# Patient Record
Sex: Male | Born: 1970 | Race: White | Hispanic: No | Marital: Married | Smoking: Current every day smoker
Health system: Southern US, Community
[De-identification: ages and names within clinical notes are randomized; demographics above are authoritative.]

## PROBLEM LIST (undated history)

## (undated) DIAGNOSIS — R011 Cardiac murmur, unspecified: Secondary | ICD-10-CM

## (undated) DIAGNOSIS — J45909 Unspecified asthma, uncomplicated: Secondary | ICD-10-CM

## (undated) DIAGNOSIS — K219 Gastro-esophageal reflux disease without esophagitis: Secondary | ICD-10-CM

---

## 2000-09-14 HISTORY — PX: ESOPHAGOGASTRODUODENOSCOPY: SHX1529

## 2001-07-18 ENCOUNTER — Inpatient Hospital Stay (HOSPITAL_COMMUNITY): Admission: EM | Admit: 2001-07-18 | Discharge: 2001-07-19 | Payer: Self-pay | Admitting: Emergency Medicine

## 2001-07-25 ENCOUNTER — Encounter: Admission: RE | Admit: 2001-07-25 | Discharge: 2001-07-25 | Payer: Self-pay

## 2005-12-21 ENCOUNTER — Emergency Department (HOSPITAL_COMMUNITY): Admission: EM | Admit: 2005-12-21 | Discharge: 2005-12-21 | Payer: Self-pay | Admitting: Family Medicine

## 2009-04-20 ENCOUNTER — Emergency Department (HOSPITAL_COMMUNITY): Admission: EM | Admit: 2009-04-20 | Discharge: 2009-04-20 | Payer: Self-pay | Admitting: Emergency Medicine

## 2011-01-30 NOTE — Discharge Summary (Signed)
[place]. [facility]  Patient:    Bryan, Flynn Visit Number: [id number] MRN: [id number]          Service Type: MED Location: [id number] Attending Physician:  Edwyna Perfect Dictated by:   Lendell Caprice, M.D. Admit Date:  07/18/2001 Discharge Date: 07/19/2001                             Discharge Summary  CONSULTATIONS:  Dr. Melvia Heaps from gastroenterology.  DISCHARGE DIAGNOSES: 1. 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 2. Tobacco abuse. 3. Migraine headaches. 4. Viral gastroenteritis.  DISCHARGE MEDICATIONS: 1. Protonix 40 mg p.o. q.d. 2. Tylenol Migraine 650 mg p.o. q.6h. p.r.n. migraine headache.  FOLLOW-UP:  Mr. Chanthavong will be seen in the [facility] by Dr. Reed Pandy on Monday, July 25, 2001, at 3:45 p.m.  At that time the follow-up CBC will be obtained to assess the patients anemia.  PROCEDURES AND DIAGNOSTIC STUDIES:  EGD performed on July 18, 2001, by Dr. Melvia Heaps.  Impression:  Mallory-Weiss tear.  This was injected with epinephrine 4 cc, an four endoscopic clips were applied.  The patient tolerated this procedure well, without complications.  HISTORY OF PRESENT ILLNESS:  [name] is a 40 year old white male with no significant past medical history of who presents to the ED complaining of hematemesis.  He reports feeling nauseated and began vomiting bright red blood starting at 1 a.m.Marland Kitchen  Previous to that he had vomited several times nonbilious material.  He had approximately 10 episodes of vomiting overnight and early morning.  He denied any abdominal pain, fever, melena, diarrhea, or bright red blood per rectum.  He denies any changes in his stool.  His last bowel movement was this morning and was normal.  He reports a long history of heartburn, epigastric pain that he usually treats with over-the-counter antacids for approximately one year.  He smokes a pack  per day of cigarettes and eats spicy foods on occasion.  He reports sweats, chills with his vomiting earlier yesterday but denies any headache, rhinorrhea, or cough.  He also denies any chest pain, heart palpation, or night sweats.  ADMISSION LABORATORY DATA:  PT 13.3, PTT 28, INR 1.0.  Sodium 139, potassium 4.0, chloride 103, CO2 29, BUN 22, creatinine 0.9, glucose 111, total bilirubin 0.8, alkaline phosphatase 57, SGOT 16, SGPT 16, total protein 7.4, albumin 4.1, calcium 9.1.  White blood count 11.9, hemoglobin 15.4, platelets 271, MCV 91.7.  HOSPITAL COURSE: #1 - The patient was evaluated in the emergency room, and a stat GI consult was obtained.  He was taken immediately to the endoscopy laboratory, and a Mallory-Weiss tear was found in the lower esophagus.  The patient got four endoscopic clips and epinephrine injection.  He was admitted to the ICU with two large-bore peripheral IVs.  He was typed and screened for two units of packed red blood cells.  CBCs were followed q.6h. during his hospitalization, and he never required transfusion.  He was started on IV Protonix 40 mg q.d. and n.p.o. until GI saw him.  On follow-up after the procedure he was advanced to a clear liquid diet, and this was tolerated without complication.  He had no more emesis during his hospitalization, and advanced to a full diet on July 18, 2001.  He was discharged on Protonix with follow-up planned.  #2 - TOBACCO ABUSE:  The patient smokes about one-half to  one pack per day x 10 years.  He was offered counseling and did not desire nicotine replacement at that time.  This will need to be followed in the outpatient setting, and cessation classes and recommendations were given.  #3 - MIGRAINE HEADACHES:  The patient reported history of occasional migraine headaches since he was a child.  He was encouraged to take Tylenol Migraine p.r.n. and avoid Goodys powders, NSAIDs.  DISCHARGE LABORATORY DATA:  Sodium  140, potassium 3.7, chloride 111, CO2 25, glucose 101, BUN 9, creatinine 0.8, total bilirubin 0.5.  White blood count 11, hemoglobin 10, MCV 91.4, platelets 199. Dictated by:   Lendell Caprice, M.D. Attending Physician:  Edwyna Perfect DD:  08/11/01 TD:  08/12/01 Job: [id number]

## 2016-09-28 ENCOUNTER — Emergency Department (HOSPITAL_COMMUNITY)
Admission: EM | Admit: 2016-09-28 | Discharge: 2016-09-29 | Disposition: A | Discharge status: Home / Self Care | Payer: Self-pay | Attending: Emergency Medicine | Admitting: Emergency Medicine

## 2016-09-28 ENCOUNTER — Emergency Department (HOSPITAL_COMMUNITY): Payer: Self-pay

## 2016-09-28 ENCOUNTER — Encounter (HOSPITAL_COMMUNITY): Payer: Self-pay | Admitting: Emergency Medicine

## 2016-09-28 DIAGNOSIS — Z7982 Long term (current) use of aspirin: Secondary | ICD-10-CM | POA: Insufficient documentation

## 2016-09-28 DIAGNOSIS — F17200 Nicotine dependence, unspecified, uncomplicated: Secondary | ICD-10-CM | POA: Insufficient documentation

## 2016-09-28 DIAGNOSIS — R002 Palpitations: Secondary | ICD-10-CM | POA: Insufficient documentation

## 2016-09-28 DIAGNOSIS — E876 Hypokalemia: Secondary | ICD-10-CM | POA: Insufficient documentation

## 2016-09-28 DIAGNOSIS — Z79899 Other long term (current) drug therapy: Secondary | ICD-10-CM | POA: Insufficient documentation

## 2016-09-28 LAB — BASIC METABOLIC PANEL
ANION GAP: 9 (ref 5–15)
BUN: 7 mg/dL (ref 6–20)
CALCIUM: 9.5 mg/dL (ref 8.9–10.3)
CO2: 24 mmol/L (ref 22–32)
Chloride: 102 mmol/L (ref 101–111)
Creatinine, Ser: 1.07 mg/dL (ref 0.61–1.24)
GFR calc Af Amer: >60 mL/min (ref >60)
GLUCOSE: 125 mg/dL — AB (ref 65–99)
POTASSIUM: 3.1 mmol/L — AB (ref 3.5–5.1)
SODIUM: 135 mmol/L (ref 135–145)

## 2016-09-28 LAB — RAPID URINE DRUG SCREEN, HOSP PERFORMED
AMPHETAMINES: NOT DETECTED
Barbiturates: NOT DETECTED
Benzodiazepines: NOT DETECTED
Cocaine: NOT DETECTED
Opiates: NOT DETECTED
TETRAHYDROCANNABINOL: POSITIVE — AB

## 2016-09-28 LAB — CBC
HEMATOCRIT: 45.1 % (ref 39.0–52.0)
HEMOGLOBIN: 16.2 g/dL (ref 13.0–17.0)
MCH: 31.5 pg (ref 26.0–34.0)
MCHC: 35.9 g/dL (ref 30.0–36.0)
MCV: 87.7 fL (ref 78.0–100.0)
Platelets: 344 10*3/uL (ref 150–400)
RBC: 5.14 MIL/uL (ref 4.22–5.81)
RDW: 12.9 % (ref 11.5–15.5)
WBC: 12.1 10*3/uL — AB (ref 4.0–10.5)

## 2016-09-28 LAB — TSH: TSH: 2.054 u[IU]/mL (ref 0.350–4.500)

## 2016-09-28 LAB — URINALYSIS, ROUTINE W REFLEX MICROSCOPIC
BILIRUBIN URINE: NEGATIVE
Bacteria, UA: NONE SEEN
GLUCOSE, UA: NEGATIVE mg/dL
Ketones, ur: 5 mg/dL — AB
NITRITE: NEGATIVE
PH: 6 (ref 5.0–8.0)
Protein, ur: NEGATIVE mg/dL
SPECIFIC GRAVITY, URINE: 1.025 (ref 1.005–1.030)

## 2016-09-28 LAB — MAGNESIUM: Magnesium: 2.1 mg/dL (ref 1.7–2.4)

## 2016-09-28 LAB — D-DIMER, QUANTITATIVE: D-Dimer, Quant: <0.27 ug/mL-FEU (ref 0.00–0.50)

## 2016-09-28 LAB — I-STAT TROPONIN, ED: TROPONIN I, POC: 0.04 ng/mL (ref 0.00–0.08)

## 2016-09-28 MED ORDER — POTASSIUM CHLORIDE CRYS ER 20 MEQ PO TBCR: 20.0000 meq | EXTENDED_RELEASE_TABLET | Freq: Every day | ORAL | 0 refills | Status: DC

## 2016-09-28 MED ORDER — SODIUM CHLORIDE 0.9 % IV BOLUS (SEPSIS)
1000.0000 mL | Freq: Once | INTRAVENOUS | Status: AC
  Administered 2016-09-28: 1000 mL via INTRAVENOUS

## 2016-09-28 MED ORDER — MAGNESIUM SULFATE 2 GM/50ML IV SOLN
2.0000 g | Freq: Once | INTRAVENOUS | Status: AC
  Administered 2016-09-28: 2 g via INTRAVENOUS
  Filled 2016-09-28: qty 50

## 2016-09-28 MED ORDER — POTASSIUM CHLORIDE CRYS ER 20 MEQ PO TBCR
40.0000 meq | EXTENDED_RELEASE_TABLET | Freq: Once | ORAL | Status: AC
  Administered 2016-09-28: 40 meq via ORAL
  Filled 2016-09-28: qty 2

## 2016-09-28 NOTE — ED Provider Notes (Signed)
[facility]EM[facility]GENCY DE[facility] Provider Note   CSN: [id number] Arrival date & time: 09/28/16  2025  By signing my name below, I, Modena JanskyAlbert Thayil, attest that this documentation has been prepared under the direction and in the presence of non-physician practitioner, Wynetta EmeryNicole Katieann Hungate, PA. Electronically Signed: Modena JanskyAlbert Thayil, Scribe. 09/28/2016. 9:42 PM.  History   Chief Complaint Chief Complaint  Patient presents with  . Chest Pain   The history is provided by the patient. No language interpreter was used.   HPI Comments: Bryan Flynn is a 46 y.o. male with no pertinent PMHx who presents to the Emergency Department complaining of Intermittent moderate heart palpitations that started about 9 hours ago. He states he had multiple episodes of sudden onset heart racing/fluttering. No modifying factors. He has associated lightheadedness and "tunnel vision" with episodes. His symptoms are currently not present. He admits to a hx of smoking. He denies any hx of anxiety, leg swelling, chest pain, abdominal pain, vomiting, fever, generalized myalgias, cough, melena, blood in stool, or other complaints.   History reviewed. No pertinent past medical history.  There are no active problems to display for this patient.   Past Surgical History:  Procedure Laterality Date  . THROAT SURGERY         Home Medications    Prior to Admission medications   Medication Sig Start Date End Date Taking? Authorizing Provider  Aspirin-Acetaminophen-Caffeine (GOODYS EXTRA STRENGTH PO) Take 1-2 packets by mouth every 6 (six) hours as needed (for pain).   Yes Historical Provider, MD  DiphenhydrAMINE HCl, Sleep, (SLEEP AID) 50 MG CAPS Take 100-150 mg by mouth at bedtime.   Yes Historical Provider, MD  potassium chloride SA (K-DUR,KLOR-CON) 20 MEQ tablet Take 1 tablet (20 mEq total) by mouth daily. 09/28/16   Joni ReiningNicole Lilee Aldea, PA-C    Family History No family history on file.  Social History Social History  Substance  Use Topics  . Smoking status: Current Every Day Smoker  . Smokeless tobacco: Never Used  . Alcohol use No     Allergies   Patient has no known allergies.   Review of Systems Review of Systems A complete 10 system review of systems was obtained and all systems are negative except as noted in the HPI and PMH.   Physical Exam Updated Vital Signs BP 125/95 (BP Location: Left Arm)   Pulse 111   Temp 98.4 F (36.9 C) (Oral)   Resp 17   Ht 5\' 11"  (1.803 m)   Wt 168 lb (76.2 kg)   SpO2 97%   BMI 23.43 kg/m   Physical Exam  Constitutional: He is oriented to person, place, and time. He appears well-developed and well-nourished. No distress.  HENT:  Head: Normocephalic.  Mouth/Throat: Oropharynx is clear and moist.  Eyes: Conjunctivae are normal.  Neck: Normal range of motion. No JVD present. No tracheal deviation present.  Cardiovascular: Regular rhythm and intact distal pulses.   Mild tachycardia, regular with no murmurs.   Radial pulse equal bilaterally  Pulmonary/Chest: Effort normal and breath sounds normal. No stridor. No respiratory distress. He has no wheezes. He has no rales. He exhibits no tenderness.  Abdominal: Soft. He exhibits no distension and no mass. There is no tenderness. There is no rebound and no guarding.  Musculoskeletal: Normal range of motion. He exhibits no edema or tenderness.  No calf asymmetry, superficial collaterals, palpable cords, edema, Homans sign negative bilaterally.    Neurological: He is alert and oriented to person, place, and time.  Skin: Skin  is warm. He is not diaphoretic.  Psychiatric: He has a normal mood and affect.  Nursing note and vitals reviewed.    ED Treatments / Results  DIAGNOSTIC STUDIES: Oxygen Saturation is 97% on RA, normal by my interpretation.    COORDINATION OF CARE: 9:46 PM- Pt advised of plan for treatment and pt agrees.  Labs (all labs ordered are listed, but only abnormal results are displayed) Labs  Reviewed  BASIC METABOLIC PANEL - Abnormal; Notable for the following:       Result Value   Potassium 3.1 (*)    Glucose, Bld 125 (*)    All other components within normal limits  CBC - Abnormal; Notable for the following:    WBC 12.1 (*)    All other components within normal limits  RAPID URINE DRUG SCREEN, HOSP PERFORMED - Abnormal; Notable for the following:    Tetrahydrocannabinol POSITIVE (*)    All other components within normal limits  URINALYSIS, ROUTINE W REFLEX MICROSCOPIC - Abnormal; Notable for the following:    APPearance HAZY (*)    Hgb urine dipstick SMALL (*)    Ketones, ur 5 (*)    Leukocytes, UA TRACE (*)    Squamous Epithelial / LPF 0-5 (*)    All other components within normal limits  TSH  D-DIMER, QUANTITATIVE (NOT AT [facility])  MAGNESIUM  I-STAT TROPOININ, ED    EKG  EKG Interpretation  Date/Time:  Monday September 28 2016 20:30:47 EST Ventricular Rate:  106 PR Interval:    QRS Duration: 99 QT Interval:  328 QTC Calculation: 436 R Axis:   68 Text Interpretation:  Sinus tachycardia no acute ischemic appearance. Otherwise normal ECG No old tracing to compare Confirmed by Donnald Garre, MD, Lebron Conners [phone]) on 09/28/2016 10:51:57 PM       Radiology Dg Chest 2 View  Result Date: 09/28/2016 CLINICAL DATA:  Lightheadedness, racing heart EXAM: CHEST  2 VIEW COMPARISON:  None. FINDINGS: The heart size and mediastinal contours are within normal limits. Both lungs are clear. The visualized skeletal structures are unremarkable. IMPRESSION: No active cardiopulmonary disease. Electronically Signed   By: Jasmine Pang M.D.   On: 09/28/2016 21:08    Procedures Procedures (including critical care time)  Medications Ordered in ED Medications  potassium chloride SA (K-DUR,KLOR-CON) CR tablet 40 mEq (40 mEq Oral Given 09/28/16 2233)  sodium chloride 0.9 % bolus 1,000 mL (0 mLs Intravenous Stopped 09/29/16 0018)  magnesium sulfate IVPB 2 g 50 mL (0 g Intravenous Stopped 09/28/16  2333)     Initial Impression / Assessment and Plan / ED Course  I have reviewed the triage vital signs and the nursing notes.  Pertinent labs & imaging results that were available during my care of the patient were reviewed by me and considered in my medical decision making (see chart for details).  Clinical Course      Vitals:   09/28/16 2300 09/28/16 2330 09/29/16 0000 09/29/16 0016  BP: 116/82 111/81 119/86   Pulse:    81  Resp: 18 25 18 22   Temp:      TempSrc:      SpO2:    93%  Weight:      Height:        Medications  potassium chloride SA (K-DUR,KLOR-CON) CR tablet 40 mEq (40 mEq Oral Given 09/28/16 2233)  sodium chloride 0.9 % bolus 1,000 mL (0 mLs Intravenous Stopped 09/29/16 0018)  magnesium sulfate IVPB 2 g 50 mL (0 g Intravenous Stopped 09/28/16 2333)  Bryan Flynn is 46 y.o. male presenting with Intermittent tachycardia with presyncope. No associated chest pain mild hypokalemia, EKG with sinus tachycardia. Mild hypokalemia with normal magnesium. Patient is given a liter bolus and tachycardia has resolved, vital signs are not orthostatic. Negative d-dimer, normal TSH, no amphetamines on UDS. She possibly be due to a dehydration. There is no associated chest pain with it. I don't think patient needs a second troponin. Case management consulted so patient can establish primary care. He will be given a referral to cardiology for further evaluation of palpitations.  Evaluation does not show pathology that would require ongoing emergent intervention or inpatient treatment. Pt is hemodynamically stable and mentating appropriately. Discussed findings and plan with patient/guardian, who agrees with care plan. All questions answered. Return precautions discussed and outpatient follow up given.     Final Clinical Impressions(s) / ED Diagnoses   Final diagnoses:  Palpitations  Hypokalemia    New Prescriptions Discharge Medication List as of 09/28/2016 11:41 PM    START  taking these medications   Details  potassium chloride SA (K-DUR,KLOR-CON) 20 MEQ tablet Take 1 tablet (20 mEq total) by mouth daily., Starting Mon 09/28/2016, Print       I personally performed the services described in this documentation, which was scribed in my presence. The recorded information has been reviewed and is accurate.     Wynetta Emery, PA-C 09/29/16 0150    Arby Barrette, MD 10/08/16 1450

## 2016-09-28 NOTE — Discharge Instructions (Signed)
Please follow with your primary care doctor in the next 2 days for a check-up. They must obtain records for further management.  ° °Do not hesitate to return to the Emergency Department for any new, worsening or concerning symptoms.  ° °

## 2016-09-28 NOTE — ED Triage Notes (Signed)
Pt c/o his "heart racing really fast, then going back to a normal heart rate and then feeling really light headed and tunnel vision.' Pt reports having about 4 of these episodes today, original started around 130 this afternoon.   Reports this has happened before but this is the first time he has felt concerned and wanted to get evaluated.

## 2016-09-28 NOTE — ED Notes (Signed)
Patient transported to X-ray 

## 2016-09-28 NOTE — Care Management (Signed)
[facility] to schedule an ED follow up appointment [facility]. [place] No further ED CM needs identified.

## 2016-10-20 NOTE — Progress Notes (Deleted)
   Cardiology Office Note   Date:  10/20/2016   ID:  Bryan Flynn, DOB 06/10/1971, MRN [id number]  PCP:  No primary care provider on file.  Cardiologist:   Dietrich PatesPaula Majesta Leichter, MD       History of Present Illness: Bryan Flynn is a 46 y.o. male with a history of palpitations  Seen in ER on 09/28/16   DizzyMild hypokalemia  Rx IV fluid  No orhtostatic  ? Dehydration.       No outpatient prescriptions have been marked as taking for the 10/22/16 encounter (Appointment) with Pricilla RifflePaula Genie Wenke V, MD.     Allergies:   Patient has no known allergies.   No past medical history on file.  Past Surgical History:  Procedure Laterality Date  . THROAT SURGERY       Social History:  The patient  reports that he has been smoking.  He has never used smokeless tobacco. He reports that he does not drink alcohol.   Family History:  The patient's family history is not on file.    ROS:  Please see the history of present illness. All other systems are reviewed and  Negative to the above problem except as noted.    PHYSICAL EXAM: VS:  There were no vitals taken for this visit.  GEN: Well nourished, well developed, in no acute distress  HEENT: normal  Neck: no JVD, carotid bruits, or masses Cardiac: RRR; no murmurs, rubs, or gallops,no edema  Respiratory:  clear to auscultation bilaterally, normal work of breathing GI: soft, nontender, nondistended, + BS  No hepatomegaly  MS: no deformity Moving all extremities   Skin: warm and dry, no rash Neuro:  Strength and sensation are intact Psych: euthymic mood, full affect   EKG:  EKG is ordered today.   Lipid Panel No results found for: CHOL, TRIG, HDL, CHOLHDL, VLDL, LDLCALC, LDLDIRECT    Wt Readings from Last 3 Encounters:  09/28/16 168 lb (76.2 kg)      ASSESSMENT AND PLAN:     Current medicines are reviewed at length with the patient today.  The patient does not have concerns regarding medicines.  Signed, Dietrich PatesPaula Jozeph Persing, MD  10/20/2016 2:25  PM    [facility] [address], [place], [place]  [address] Phone: [phone]; Fax: [phone]

## 2016-10-22 ENCOUNTER — Ambulatory Visit: Payer: Self-pay | Admitting: Internal Medicine

## 2017-10-15 DIAGNOSIS — K219 Gastro-esophageal reflux disease without esophagitis: Secondary | ICD-10-CM

## 2017-10-15 HISTORY — DX: Gastro-esophageal reflux disease without esophagitis: K21.9

## 2017-10-23 ENCOUNTER — Encounter (HOSPITAL_COMMUNITY): Payer: Self-pay | Admitting: Emergency Medicine

## 2017-10-23 ENCOUNTER — Ambulatory Visit (HOSPITAL_COMMUNITY)
Admission: EM | Admit: 2017-10-23 | Discharge: 2017-10-23 | Disposition: A | Discharge status: Home / Self Care | Payer: Commercial Managed Care - PPO | Attending: Physician Assistant | Admitting: Physician Assistant

## 2017-10-23 DIAGNOSIS — K529 Noninfective gastroenteritis and colitis, unspecified: Secondary | ICD-10-CM

## 2017-10-23 DIAGNOSIS — R197 Diarrhea, unspecified: Secondary | ICD-10-CM

## 2017-10-23 DIAGNOSIS — R112 Nausea with vomiting, unspecified: Secondary | ICD-10-CM | POA: Diagnosis not present

## 2017-10-23 MED ORDER — ONDANSETRON HCL 4 MG PO TABS: 4.0000 mg | ORAL_TABLET | Freq: Three times a day (TID) | ORAL | 0 refills | Status: AC | PRN

## 2017-10-23 MED ORDER — ONDANSETRON 4 MG PO TBDP
ORAL_TABLET | ORAL | Status: AC
  Filled 2017-10-23: qty 1

## 2017-10-23 MED ORDER — ONDANSETRON 4 MG PO TBDP
4.0000 mg | ORAL_TABLET | Freq: Once | ORAL | Status: AC
  Administered 2017-10-23: 4 mg via ORAL

## 2017-10-23 NOTE — Discharge Instructions (Signed)
This probably a stomach bug/virus. Treat with Zofran for nausea and vomiting and try your best to sip liquids, when you start to eat make it a bland diet of crackers/bread just low fat. Certainly if you are not improving or worsening then f/u . Feel better.

## 2017-10-23 NOTE — ED Triage Notes (Signed)
PT reports abdominal pain, vomiting, slight diarrhea, headache that started at 330 AM Friday morning.

## 2017-10-23 NOTE — ED Provider Notes (Signed)
[facility]    CSN: [id number] Arrival date & time: 10/23/17  1522     History   Chief Complaint Chief Complaint  Patient presents with  . Abdominal Pain  . Emesis    HPI Sonu Kruckenberg is a 47 y.o. male.     Who presents with N, V and slight diarrhea since early yesterday morning. He notes some abdominal cramping as well. Emesis seem to alleviate the cramping. No fever or chills. No bloody stools. No known exposures.       History reviewed. No pertinent past medical history.  There are no active problems to display for this patient.   Past Surgical History:  Procedure Laterality Date  . THROAT SURGERY         Home Medications    Prior to Admission medications   Medication Sig Start Date End Date Taking? Authorizing Provider  Aspirin-Acetaminophen-Caffeine (GOODYS EXTRA STRENGTH PO) Take 1-2 packets by mouth every 6 (six) hours as needed (for pain).    [provider]  DiphenhydrAMINE HCl, Sleep, (SLEEP AID) 50 MG CAPS Take 100-150 mg by mouth at bedtime.    [provider]  potassium chloride SA (K-DUR,KLOR-CON) 20 MEQ tablet Take 1 tablet (20 mEq total) by mouth daily. 09/28/16   Pisciotta, Mardella Layman    Family History No family history on file.  Social History Social History   Tobacco Use  . Smoking status: Current Every Day Smoker  . Smokeless tobacco: Never Used  Substance Use Topics  . Alcohol use: No  . Drug use: Not on file     Allergies   Patient has no known allergies.   Review of Systems Review of Systems  Constitutional: Positive for fatigue. Negative for fever.  Gastrointestinal: Positive for abdominal pain, diarrhea, nausea and vomiting. Negative for blood in stool and constipation.  Genitourinary: Negative.   Skin: Negative for rash.  Neurological: Positive for headaches.     Physical Exam Triage Vital Signs ED Triage Vitals  Enc Vitals Group     BP 10/23/17 1659 124/80     Pulse Rate  10/23/17 1659 100     Resp 10/23/17 1659 16     Temp 10/23/17 1659 98.8 F (37.1 C)     Temp Source 10/23/17 1659 Oral     SpO2 10/23/17 1659 100 %     Weight 10/23/17 1657 186 lb (84.4 kg)     Height --      Head Circumference --      Peak Flow --      Pain Score 10/23/17 1657 8     Pain Loc --      Pain Edu? --      Excl. in GC? --    No data found.  Updated Vital Signs BP 124/80   Pulse 100   Temp 98.8 F (37.1 C) (Oral)   Resp 16   Wt 186 lb (84.4 kg)   SpO2 100%   BMI 25.94 kg/m   Visual Acuity Right Eye Distance:   Left Eye Distance:   Bilateral Distance:    Right Eye Near:   Left Eye Near:    Bilateral Near:     Physical Exam  Constitutional: He is oriented to person, place, and time. He appears well-developed and well-nourished.  Non-toxic appearance. He does not appear ill. No distress.  Abdominal: Bowel sounds are normal. He exhibits no distension and no ascites. There is generalized tenderness. There is no rigidity, no rebound, no guarding  and negative Murphy's sign.  Neurological: He is alert and oriented to person, place, and time.  Skin: Skin is warm and dry.  Psychiatric: He has a normal mood and affect. His behavior is normal.  Nursing note and vitals reviewed.    UC Treatments / Results  Labs (all labs ordered are listed, but only abnormal results are displayed) Labs Reviewed - No data to display  EKG  EKG Interpretation None       Radiology No results found.  Procedures Procedures (including critical care time)  Medications Ordered in UC Medications - No data to display   Initial Impression / Assessment and Plan / UC Course  I have reviewed the triage vital signs and the nursing notes.  Pertinent labs & imaging results that were available during my care of the patient were reviewed by me and considered in my medical decision making (see chart for details).     Non-acute abdomen. Treat with supportive care/fluids and rest.  If worsens may f/u.   Final Clinical Impressions(s) / UC Diagnoses   Final diagnoses:  None    ED Discharge Orders    None       Controlled Substance Prescriptions [place] Controlled Substance Registry consulted? Not Applicable   Riki SheerYoung, Cozette Braggs G, PA-C 10/23/17 1732

## 2017-10-25 ENCOUNTER — Other Ambulatory Visit: Payer: Self-pay

## 2017-10-25 ENCOUNTER — Inpatient Hospital Stay (HOSPITAL_COMMUNITY)
Admission: EM | Admit: 2017-10-25 | Discharge: 2017-10-30 | DRG: 418 | Disposition: A | Discharge status: Home / Self Care | Payer: Commercial Managed Care - PPO | Attending: Internal Medicine | Admitting: Internal Medicine

## 2017-10-25 ENCOUNTER — Emergency Department (HOSPITAL_COMMUNITY): Payer: Commercial Managed Care - PPO

## 2017-10-25 ENCOUNTER — Encounter (HOSPITAL_COMMUNITY): Payer: Self-pay | Admitting: *Deleted

## 2017-10-25 DIAGNOSIS — K8050 Calculus of bile duct without cholangitis or cholecystitis without obstruction: Secondary | ICD-10-CM

## 2017-10-25 DIAGNOSIS — E876 Hypokalemia: Secondary | ICD-10-CM | POA: Diagnosis present

## 2017-10-25 DIAGNOSIS — K2100 Gastro-esophageal reflux disease with esophagitis, without bleeding: Secondary | ICD-10-CM

## 2017-10-25 DIAGNOSIS — R945 Abnormal results of liver function studies: Secondary | ICD-10-CM

## 2017-10-25 DIAGNOSIS — K8062 Calculus of gallbladder and bile duct with acute cholecystitis without obstruction: Secondary | ICD-10-CM | POA: Diagnosis not present

## 2017-10-25 DIAGNOSIS — E860 Dehydration: Secondary | ICD-10-CM | POA: Diagnosis present

## 2017-10-25 DIAGNOSIS — R933 Abnormal findings on diagnostic imaging of other parts of digestive tract: Secondary | ICD-10-CM

## 2017-10-25 DIAGNOSIS — R1011 Right upper quadrant pain: Secondary | ICD-10-CM

## 2017-10-25 DIAGNOSIS — R1013 Epigastric pain: Secondary | ICD-10-CM | POA: Diagnosis present

## 2017-10-25 DIAGNOSIS — F17210 Nicotine dependence, cigarettes, uncomplicated: Secondary | ICD-10-CM | POA: Diagnosis present

## 2017-10-25 DIAGNOSIS — K2210 Ulcer of esophagus without bleeding: Secondary | ICD-10-CM | POA: Diagnosis present

## 2017-10-25 DIAGNOSIS — K8042 Calculus of bile duct with acute cholecystitis without obstruction: Secondary | ICD-10-CM

## 2017-10-25 DIAGNOSIS — K3189 Other diseases of stomach and duodenum: Secondary | ICD-10-CM | POA: Diagnosis present

## 2017-10-25 DIAGNOSIS — K449 Diaphragmatic hernia without obstruction or gangrene: Secondary | ICD-10-CM | POA: Diagnosis present

## 2017-10-25 DIAGNOSIS — R7989 Other specified abnormal findings of blood chemistry: Secondary | ICD-10-CM

## 2017-10-25 HISTORY — DX: Unspecified asthma, uncomplicated: J45.909

## 2017-10-25 HISTORY — DX: Cardiac murmur, unspecified: R01.1

## 2017-10-25 HISTORY — DX: Gastro-esophageal reflux disease without esophagitis: K21.9

## 2017-10-25 LAB — COMPREHENSIVE METABOLIC PANEL
ALT: 242 U/L — AB (ref 17–63)
AST: 86 U/L — AB (ref 15–41)
Albumin: 3.9 g/dL (ref 3.5–5.0)
Alkaline Phosphatase: 162 U/L — ABNORMAL HIGH (ref 38–126)
Anion gap: 13 (ref 5–15)
BILIRUBIN TOTAL: 1.2 mg/dL (ref 0.3–1.2)
BUN: 6 mg/dL (ref 6–20)
CALCIUM: 9.3 mg/dL (ref 8.9–10.3)
CO2: 24 mmol/L (ref 22–32)
CREATININE: 1.03 mg/dL (ref 0.61–1.24)
Chloride: 100 mmol/L — ABNORMAL LOW (ref 101–111)
Glucose, Bld: 106 mg/dL — ABNORMAL HIGH (ref 65–99)
Potassium: 3.3 mmol/L — ABNORMAL LOW (ref 3.5–5.1)
Sodium: 137 mmol/L (ref 135–145)
Total Protein: 7.5 g/dL (ref 6.5–8.1)

## 2017-10-25 LAB — URINALYSIS, ROUTINE W REFLEX MICROSCOPIC
BILIRUBIN URINE: NEGATIVE
GLUCOSE, UA: NEGATIVE mg/dL
Ketones, ur: NEGATIVE mg/dL
Nitrite: NEGATIVE
PH: 6 (ref 5.0–8.0)
PROTEIN: NEGATIVE mg/dL
SPECIFIC GRAVITY, URINE: 1.015 (ref 1.005–1.030)

## 2017-10-25 LAB — CBC
HCT: 45.8 % (ref 39.0–52.0)
Hemoglobin: 16.2 g/dL (ref 13.0–17.0)
MCH: 32.1 pg (ref 26.0–34.0)
MCHC: 35.4 g/dL (ref 30.0–36.0)
MCV: 90.9 fL (ref 78.0–100.0)
PLATELETS: 276 10*3/uL (ref 150–400)
RBC: 5.04 MIL/uL (ref 4.22–5.81)
RDW: 13 % (ref 11.5–15.5)
WBC: 13 10*3/uL — AB (ref 4.0–10.5)

## 2017-10-25 LAB — LIPASE, BLOOD: Lipase: 40 U/L (ref 11–51)

## 2017-10-25 MED ORDER — PANTOPRAZOLE SODIUM 40 MG IV SOLR
40.0000 mg | Freq: Once | INTRAVENOUS | Status: AC
  Administered 2017-10-25: 40 mg via INTRAVENOUS
  Filled 2017-10-25: qty 40

## 2017-10-25 MED ORDER — FENTANYL CITRATE (PF) 100 MCG/2ML IJ SOLN
INTRAMUSCULAR | Status: AC
  Filled 2017-10-25: qty 2

## 2017-10-25 MED ORDER — IOPAMIDOL (ISOVUE-300) INJECTION 61%
INTRAVENOUS | Status: AC
  Administered 2017-10-25: 100 mL
  Filled 2017-10-25: qty 100

## 2017-10-25 MED ORDER — FENTANYL CITRATE (PF) 100 MCG/2ML IJ SOLN
50.0000 ug | INTRAMUSCULAR | Status: AC | PRN
  Administered 2017-10-25 – 2017-10-26 (×2): 50 ug via INTRAVENOUS
  Filled 2017-10-25: qty 2

## 2017-10-25 MED ORDER — HYDROMORPHONE HCL 1 MG/ML IJ SOLN
1.0000 mg | Freq: Once | INTRAMUSCULAR | Status: AC
  Administered 2017-10-25: 1 mg via INTRAVENOUS
  Filled 2017-10-25: qty 1

## 2017-10-25 MED ORDER — ONDANSETRON HCL 4 MG/2ML IJ SOLN
4.0000 mg | Freq: Once | INTRAMUSCULAR | Status: AC
  Administered 2017-10-25: 4 mg via INTRAVENOUS
  Filled 2017-10-25: qty 2

## 2017-10-25 MED ORDER — SODIUM CHLORIDE 0.9 % IV BOLUS (SEPSIS)
1000.0000 mL | Freq: Once | INTRAVENOUS | Status: AC
  Administered 2017-10-25: 1000 mL via INTRAVENOUS

## 2017-10-25 NOTE — ED Provider Notes (Signed)
[facility] EMERGENCY DEPARTMENT Provider Note   CSN: [id number] Arrival date & time: 10/25/17  1952     History   Chief Complaint Chief Complaint  Patient presents with  . Abdominal Pain    HPI Rahsaan Weakland is a 47 y.o. male.  HPI   This is a 46 year old male who presents with abdominal pain.  Reports onset of abdominal pain on Friday.  Upper abdominal pain that is sharp and nonradiating.  He states that "I was doubled over."  He has had multiple episodes of nonbilious, nonbloody S's.  He did have one episode of diarrhea.  He was seen in urgent care on Saturday and was told that he might have "a GI bug."  His pain has persisted.  He states that he has not been able to eat much.  Nothing seems to make the pain better or worse.  He has taken several Goody and BC powders.  Currently rates his pain at 9 out of 10.  Did have some relief with pain medication in the waiting room.  Denies fevers, recent illnesses, chest pain, shortness of breath. History reviewed. No pertinent past medical history.  There are no active problems to display for this patient.   Past Surgical History:  Procedure Laterality Date  . THROAT SURGERY         Home Medications    Prior to Admission medications   Medication Sig Start Date End Date Taking? Authorizing Provider  ondansetron (ZOFRAN) 4 MG tablet Take 1 tablet (4 mg total) by mouth every 8 (eight) hours as needed for nausea or vomiting. 10/23/17  Yes Young, Dillard Cannon, PA-C  Aspirin-Acetaminophen-Caffeine (GOODYS EXTRA STRENGTH PO) Take 1-2 packets by mouth every 6 (six) hours as needed (for pain).    [provider]  potassium chloride SA (K-DUR,KLOR-CON) 20 MEQ tablet Take 1 tablet (20 mEq total) by mouth daily. Patient not taking: Reported on 10/25/2017 09/28/16   Pisciotta, Mardella Layman    Family History No family history on file.  Social History Social History   Tobacco Use  . Smoking status: Current Every  Day Smoker  . Smokeless tobacco: Never Used  Substance Use Topics  . Alcohol use: No  . Drug use: No     Allergies   Patient has no known allergies.   Review of Systems Review of Systems  Constitutional: Negative for fever.  Respiratory: Negative for shortness of breath.   Cardiovascular: Negative for chest pain.  Gastrointestinal: Positive for abdominal pain, nausea and vomiting. Negative for diarrhea.  Genitourinary: Negative for dysuria.  Neurological: Negative for headaches.  All other systems reviewed and are negative.    Physical Exam Updated Vital Signs BP (!) 143/90   Pulse 70   Temp 98.5 F (36.9 C)   Resp 18   SpO2 99%   Physical Exam  Constitutional: He is oriented to person, place, and time. He appears well-developed and well-nourished. He does not appear ill.  Ill-appearing but nontoxic  HENT:  Head: Normocephalic and atraumatic.  Mucous membranes dry  Neck: Neck supple.  Cardiovascular: Normal rate, regular rhythm and normal heart sounds.  No murmur heard. Pulmonary/Chest: Effort normal and breath sounds normal. No respiratory distress. He has no wheezes.  Abdominal: Soft. Normal appearance and bowel sounds are normal. There is tenderness in the epigastric area. There is no rebound and no guarding.  Epigastric tenderness to palpation without rebound or guarding  Musculoskeletal: He exhibits no edema.  Lymphadenopathy:    He  has no cervical adenopathy.  Neurological: He is alert and oriented to person, place, and time.  Skin: Skin is warm and dry.  Psychiatric: He has a normal mood and affect.  Nursing note and vitals reviewed.    ED Treatments / Results  Labs (all labs ordered are listed, but only abnormal results are displayed) Labs Reviewed  COMPREHENSIVE METABOLIC PANEL - Abnormal; Notable for the following components:      Result Value   Potassium 3.3 (*)    Chloride 100 (*)    Glucose, Bld 106 (*)    AST 86 (*)    ALT 242 (*)     Alkaline Phosphatase 162 (*)    All other components within normal limits  CBC - Abnormal; Notable for the following components:   WBC 13.0 (*)    All other components within normal limits  URINALYSIS, ROUTINE W REFLEX MICROSCOPIC - Abnormal; Notable for the following components:   Hgb urine dipstick SMALL (*)    Leukocytes, UA TRACE (*)    Bacteria, UA RARE (*)    Squamous Epithelial / LPF 0-5 (*)    All other components within normal limits  LIPASE, BLOOD    EKG  EKG Interpretation None       Radiology Ct Abdomen Pelvis W Contrast  Result Date: 10/25/2017 CLINICAL DATA:  Acute onset of umbilical abdominal pain and nausea. EXAM: CT ABDOMEN AND PELVIS WITH CONTRAST TECHNIQUE: Multidetector CT imaging of the abdomen and pelvis was performed using the standard protocol following bolus administration of intravenous contrast. CONTRAST:  100 mL ISOVUE-300 IOPAMIDOL (ISOVUE-300) INJECTION 61% COMPARISON:  None. FINDINGS: Lower chest: The visualized lung bases are grossly clear. The visualized portions of the mediastinum are unremarkable. There is focal wall thickening and minimal calcification at the distal esophagus, with a few adjacent nodes measuring up to 7 mm in short axis. A 9 mm node is noted at the gastroesophageal junction. Malignancy is a concern. Hepatobiliary: The liver is unremarkable in appearance. The gallbladder is unremarkable in appearance. The common bile duct remains normal in caliber. Pancreas: The pancreas is within normal limits. Spleen: The spleen is unremarkable in appearance. Adrenals/Urinary Tract: The adrenal glands are unremarkable in appearance. The kidneys are within normal limits. There is no evidence of hydronephrosis. No renal or ureteral stones are identified. No perinephric stranding is seen. Stomach/Bowel: The stomach is unremarkable in appearance. The small bowel is within normal limits. The patient is status post appendectomy. The colon is unremarkable in  appearance. Vascular/Lymphatic: Mild scattered calcification is seen along the distal abdominal aorta and its branches. A retroaortic left renal vein is noted. The inferior vena cava is grossly unremarkable. No retroperitoneal lymphadenopathy is seen. No pelvic sidewall lymphadenopathy is identified. Reproductive: The bladder is mildly distended and grossly unremarkable. The prostate is normal in size. Other: No additional soft tissue abnormalities are seen. Musculoskeletal: No acute osseous abnormalities are identified. The visualized musculature is unremarkable in appearance. IMPRESSION: 1. No acute abnormality seen to explain the patient's symptoms. 2. Focal wall thickening and minimal calcification at the distal esophagus, with a few adjacent nodes measuring up to 7 mm in short axis. 9 mm node noted at the gastroesophageal junction. This raises concern for malignancy. Endoscopy is recommended for further evaluation, when and as deemed clinically appropriate. Aortic Atherosclerosis (ICD10-I70.0). These results were called by telephone at the time of interpretation on 10/25/2017 at 10:17 pm to Dr. Margarita Grizzle, who verbally acknowledged these results. Electronically Signed   By: Leotis Shames  Chang M.D.   On: 10/25/2017 22:19    Procedures Procedures (including critical care time)  Medications Ordered in ED Medications  fentaNYL (SUBLIMAZE) injection 50 mcg (50 mcg Intravenous Given 10/25/17 2046)  fentaNYL (SUBLIMAZE) 100 MCG/2ML injection (not administered)  HYDROmorphone (DILAUDID) injection 1 mg (not administered)  ondansetron (ZOFRAN) injection 4 mg (not administered)  sodium chloride 0.9 % bolus 1,000 mL (not administered)  pantoprazole (PROTONIX) injection 40 mg (not administered)  iopamidol (ISOVUE-300) 61 % injection (100 mLs  Contrast Given 10/25/17 2136)     Initial Impression / Assessment and Plan / ED Course  I have reviewed the triage vital signs and the nursing notes.  Pertinent labs &  imaging results that were available during my care of the patient were reviewed by me and considered in my medical decision making (see chart for details).     Patient presents with abdominal pain.  Overall nontoxic on exam.  He does appear ill.  Vital signs are reassuring.  Abdominal exam is notable for tenderness without signs of peritonitis.  Workup reviewed from triage.  Patient has moderate LFT elevations.  CT scan is concerning for wall thickening and calcification of the distal esophagus.  Patient does tell me that over 15 years ago he had "a tear in my esophagus that had to be stitched up."  Patient is still in fairly significant pain.  Feel he needs to be admitted for EGD and further workup.  Patient was given fluids, pain medication, IV Protonix.  Plan for admission to the hospitalist.    Final Clinical Impressions(s) / ED Diagnoses   Final diagnoses:  Epigastric pain  Elevated LFTs    ED Discharge Orders    None       Shon BatonHorton, Jeanna Giuffre F, MD 10/25/17 2336

## 2017-10-25 NOTE — ED Triage Notes (Signed)
Pt has been having sharp stabbing abd pain since Friday morning with NV. [facility] told [facility] had [facility], given antiemetics with some relief. Pt has tried antiacids and pepto bismol without any relief. Pt reports sharp stabbing pains to R flank, denies chest pain or sob. Pain appears more localized to RUQ, tender in upper quadrants on palpation

## 2017-10-25 NOTE — ED Notes (Signed)
ED Provider at bedside. 

## 2017-10-26 ENCOUNTER — Encounter (HOSPITAL_COMMUNITY): Payer: Self-pay | Admitting: Certified Registered Nurse Anesthetist

## 2017-10-26 ENCOUNTER — Other Ambulatory Visit (HOSPITAL_COMMUNITY): Payer: Self-pay

## 2017-10-26 ENCOUNTER — Encounter (HOSPITAL_COMMUNITY): Payer: Self-pay | Admitting: Internal Medicine

## 2017-10-26 ENCOUNTER — Encounter (HOSPITAL_COMMUNITY)
Admission: EM | Disposition: A | Discharge status: Home / Self Care | Payer: Self-pay | Source: Home / Self Care | Attending: Internal Medicine

## 2017-10-26 ENCOUNTER — Other Ambulatory Visit: Payer: Self-pay

## 2017-10-26 DIAGNOSIS — E860 Dehydration: Secondary | ICD-10-CM | POA: Diagnosis present

## 2017-10-26 DIAGNOSIS — K2210 Ulcer of esophagus without bleeding: Secondary | ICD-10-CM | POA: Diagnosis present

## 2017-10-26 DIAGNOSIS — F17210 Nicotine dependence, cigarettes, uncomplicated: Secondary | ICD-10-CM | POA: Diagnosis present

## 2017-10-26 DIAGNOSIS — D72829 Elevated white blood cell count, unspecified: Secondary | ICD-10-CM

## 2017-10-26 DIAGNOSIS — K8042 Calculus of bile duct with acute cholecystitis without obstruction: Secondary | ICD-10-CM | POA: Diagnosis not present

## 2017-10-26 DIAGNOSIS — K2100 Gastro-esophageal reflux disease with esophagitis, without bleeding: Secondary | ICD-10-CM

## 2017-10-26 DIAGNOSIS — K810 Acute cholecystitis: Secondary | ICD-10-CM | POA: Diagnosis not present

## 2017-10-26 DIAGNOSIS — E876 Hypokalemia: Secondary | ICD-10-CM

## 2017-10-26 DIAGNOSIS — R933 Abnormal findings on diagnostic imaging of other parts of digestive tract: Secondary | ICD-10-CM

## 2017-10-26 DIAGNOSIS — R7989 Other specified abnormal findings of blood chemistry: Secondary | ICD-10-CM

## 2017-10-26 DIAGNOSIS — K8062 Calculus of gallbladder and bile duct with acute cholecystitis without obstruction: Secondary | ICD-10-CM | POA: Diagnosis present

## 2017-10-26 DIAGNOSIS — K449 Diaphragmatic hernia without obstruction or gangrene: Secondary | ICD-10-CM | POA: Diagnosis present

## 2017-10-26 DIAGNOSIS — J45909 Unspecified asthma, uncomplicated: Secondary | ICD-10-CM

## 2017-10-26 DIAGNOSIS — R945 Abnormal results of liver function studies: Secondary | ICD-10-CM

## 2017-10-26 DIAGNOSIS — R1013 Epigastric pain: Secondary | ICD-10-CM | POA: Diagnosis not present

## 2017-10-26 DIAGNOSIS — R1011 Right upper quadrant pain: Secondary | ICD-10-CM

## 2017-10-26 DIAGNOSIS — K3189 Other diseases of stomach and duodenum: Secondary | ICD-10-CM | POA: Diagnosis present

## 2017-10-26 HISTORY — PX: ESOPHAGOGASTRODUODENOSCOPY: SHX5428

## 2017-10-26 HISTORY — PX: ESOPHAGOGASTRODUODENOSCOPY: SHX1529

## 2017-10-26 HISTORY — DX: Unspecified asthma, uncomplicated: J45.909

## 2017-10-26 LAB — COMPREHENSIVE METABOLIC PANEL
ALT: 249 U/L — AB (ref 17–63)
AST: 128 U/L — AB (ref 15–41)
Albumin: 3.8 g/dL (ref 3.5–5.0)
Alkaline Phosphatase: 196 U/L — ABNORMAL HIGH (ref 38–126)
Anion gap: 11 (ref 5–15)
CHLORIDE: 103 mmol/L (ref 101–111)
CO2: 22 mmol/L (ref 22–32)
Calcium: 8.9 mg/dL (ref 8.9–10.3)
Creatinine, Ser: 0.93 mg/dL (ref 0.61–1.24)
GFR calc Af Amer: >60 mL/min (ref >60)
GFR calc non Af Amer: >60 mL/min (ref >60)
Glucose, Bld: 120 mg/dL — ABNORMAL HIGH (ref 65–99)
Potassium: 3.7 mmol/L (ref 3.5–5.1)
Sodium: 136 mmol/L (ref 135–145)
Total Bilirubin: 2.1 mg/dL — ABNORMAL HIGH (ref 0.3–1.2)
Total Protein: 7 g/dL (ref 6.5–8.1)

## 2017-10-26 LAB — CBC
HCT: 44.6 % (ref 39.0–52.0)
Hemoglobin: 15.5 g/dL (ref 13.0–17.0)
MCH: 31.4 pg (ref 26.0–34.0)
MCHC: 34.8 g/dL (ref 30.0–36.0)
MCV: 90.5 fL (ref 78.0–100.0)
Platelets: 286 10*3/uL (ref 150–400)
RBC: 4.93 MIL/uL (ref 4.22–5.81)
RDW: 13.1 % (ref 11.5–15.5)
WBC: 13.8 10*3/uL — ABNORMAL HIGH (ref 4.0–10.5)

## 2017-10-26 LAB — ACETAMINOPHEN LEVEL

## 2017-10-26 LAB — TSH: TSH: 4.044 u[IU]/mL (ref 0.350–4.500)

## 2017-10-26 LAB — HIV ANTIBODY (ROUTINE TESTING W REFLEX): HIV Screen 4th Generation wRfx: NONREACTIVE

## 2017-10-26 LAB — PHOSPHORUS
Phosphorus: 3.5 mg/dL (ref 2.5–4.6)
Phosphorus: 2.9 mg/dL (ref 2.5–4.6)

## 2017-10-26 LAB — RAPID URINE DRUG SCREEN, HOSP PERFORMED
Amphetamines: NOT DETECTED
BARBITURATES: NOT DETECTED
Benzodiazepines: NOT DETECTED
Cocaine: NOT DETECTED
OPIATES: POSITIVE — AB
TETRAHYDROCANNABINOL: POSITIVE — AB

## 2017-10-26 LAB — CK: CK TOTAL: 54 U/L (ref 49–397)

## 2017-10-26 LAB — MAGNESIUM
Magnesium: 2.1 mg/dL (ref 1.7–2.4)
Magnesium: 2 mg/dL (ref 1.7–2.4)

## 2017-10-26 SURGERY — EGD (ESOPHAGOGASTRODUODENOSCOPY)
Anesthesia: Monitor Anesthesia Care

## 2017-10-26 MED ORDER — ONDANSETRON HCL 4 MG/2ML IJ SOLN
4.0000 mg | Freq: Four times a day (QID) | INTRAMUSCULAR | Status: DC | PRN
  Administered 2017-10-26 – 2017-10-27 (×4): 4 mg via INTRAVENOUS
  Filled 2017-10-26 (×3): qty 2

## 2017-10-26 MED ORDER — MIDAZOLAM HCL 10 MG/2ML IJ SOLN
INTRAMUSCULAR | Status: DC | PRN
  Administered 2017-10-26: 1 mg via INTRAVENOUS
  Administered 2017-10-26 (×2): 2 mg via INTRAVENOUS

## 2017-10-26 MED ORDER — PANTOPRAZOLE SODIUM 40 MG IV SOLR
40.0000 mg | Freq: Two times a day (BID) | INTRAVENOUS | Status: DC
  Administered 2017-10-26 – 2017-10-30 (×8): 40 mg via INTRAVENOUS
  Filled 2017-10-26 (×8): qty 40

## 2017-10-26 MED ORDER — POTASSIUM CHLORIDE 10 MEQ/100ML IV SOLN
10.0000 meq | INTRAVENOUS | Status: AC
  Administered 2017-10-26 (×2): 10 meq via INTRAVENOUS
  Filled 2017-10-26 (×2): qty 100

## 2017-10-26 MED ORDER — FENTANYL CITRATE (PF) 100 MCG/2ML IJ SOLN
INTRAMUSCULAR | Status: DC | PRN
  Administered 2017-10-26 (×2): 25 ug via INTRAVENOUS

## 2017-10-26 MED ORDER — SODIUM CHLORIDE 0.9 % IV SOLN
INTRAVENOUS | Status: AC
  Administered 2017-10-26: 05:00:00 via INTRAVENOUS

## 2017-10-26 MED ORDER — HYDROMORPHONE HCL 1 MG/ML IJ SOLN
1.0000 mg | INTRAMUSCULAR | Status: DC | PRN
  Administered 2017-10-26 – 2017-10-27 (×10): 1 mg via INTRAVENOUS
  Filled 2017-10-26 (×10): qty 1

## 2017-10-26 MED ORDER — SODIUM CHLORIDE 0.9 % IV SOLN
INTRAVENOUS | Status: DC
  Administered 2017-10-26 (×2): via INTRAVENOUS

## 2017-10-26 MED ORDER — MIDAZOLAM HCL 5 MG/ML IJ SOLN
INTRAMUSCULAR | Status: AC
  Filled 2017-10-26: qty 3

## 2017-10-26 MED ORDER — HYDROMORPHONE HCL 1 MG/ML IJ SOLN
1.0000 mg | Freq: Once | INTRAMUSCULAR | Status: AC
  Administered 2017-10-26: 1 mg via INTRAVENOUS
  Filled 2017-10-26: qty 1

## 2017-10-26 MED ORDER — DIPHENHYDRAMINE HCL 50 MG/ML IJ SOLN
INTRAMUSCULAR | Status: AC
  Filled 2017-10-26: qty 1

## 2017-10-26 MED ORDER — FENTANYL CITRATE (PF) 100 MCG/2ML IJ SOLN
INTRAMUSCULAR | Status: AC
  Filled 2017-10-26: qty 4

## 2017-10-26 MED ORDER — HYDROMORPHONE HCL 1 MG/ML IJ SOLN
0.5000 mg | Freq: Once | INTRAMUSCULAR | Status: AC
  Administered 2017-10-26: 0.5 mg via INTRAVENOUS
  Filled 2017-10-26: qty 1

## 2017-10-26 MED ORDER — ONDANSETRON HCL 4 MG PO TABS: 4.0000 mg | ORAL_TABLET | Freq: Four times a day (QID) | ORAL | Status: DC | PRN

## 2017-10-26 MED ORDER — PANTOPRAZOLE SODIUM 40 MG IV SOLR
40.0000 mg | Freq: Two times a day (BID) | INTRAVENOUS | Status: DC
  Administered 2017-10-26 (×2): 40 mg via INTRAVENOUS
  Filled 2017-10-26 (×2): qty 40

## 2017-10-26 MED ORDER — BUTAMBEN-TETRACAINE-BENZOCAINE 2-2-14 % EX AERO
INHALATION_SPRAY | CUTANEOUS | Status: DC | PRN
  Administered 2017-10-26: 2 via TOPICAL

## 2017-10-26 MED ORDER — PANTOPRAZOLE SODIUM 40 MG PO TBEC: 40.0000 mg | DELAYED_RELEASE_TABLET | Freq: Every day | ORAL | Status: DC

## 2017-10-26 MED ORDER — DIPHENHYDRAMINE HCL 50 MG/ML IJ SOLN
INTRAMUSCULAR | Status: DC | PRN
  Administered 2017-10-26: 25 mg via INTRAVENOUS

## 2017-10-26 MED ORDER — HYOSCYAMINE SULFATE 0.125 MG SL SUBL
0.2500 mg | SUBLINGUAL_TABLET | Freq: Once | SUBLINGUAL | Status: AC
  Administered 2017-10-26: 0.25 mg via SUBLINGUAL
  Filled 2017-10-26: qty 2

## 2017-10-26 MED ORDER — ONDANSETRON HCL 4 MG/2ML IJ SOLN
INTRAMUSCULAR | Status: AC
  Filled 2017-10-26: qty 2

## 2017-10-26 NOTE — Progress Notes (Signed)
Patient admitted after midnight, please see H&P.  GI to see.  IV pain medications added.    Marlin CanaryJessica Kypton Eltringham DO

## 2017-10-26 NOTE — Op Note (Signed)
[facility] Patient Name: Bryan Flynn Procedure Date : 10/26/2017 MRN: [id number] Attending MD: Iva Boop , MD Date of Birth: [birth year] CSN: [id number] Age: 47 Admit Type: Inpatient Procedure:                Upper GI endoscopy Indications:              Epigastric abdominal pain, Abdominal pain in the                            right upper quadrant, Abnormal CT of the GI tract Providers:                Iva Boop, MD, Tomma Rakers, RN, Madalyn Rob, Technician Referring MD:              Medicines:                Midazolam 5 mg IV, Fentanyl 50 micrograms IV,                            Diphenhydramine 25 mg IV Complications:            No immediate complications. Estimated Blood Loss:     Estimated blood loss was minimal. Procedure:                Pre-Anesthesia Assessment:                           - Prior to the procedure, a History and Physical                            was performed, and patient medications and                            allergies were reviewed. The patient's tolerance of                            previous anesthesia was also reviewed. The risks                            and benefits of the procedure and the sedation                            options and risks were discussed with the patient.                            All questions were answered, and informed consent                            was obtained. Prior Anticoagulants: The patient has                            taken no previous anticoagulant or antiplatelet  agents. ASA Grade Assessment: II - A patient with                            mild systemic disease. After reviewing the risks                            and benefits, the patient was deemed in                            satisfactory condition to undergo the procedure.                           After obtaining informed consent, the endoscope was              passed under direct vision. Throughout the                            procedure, the patient's blood pressure, pulse, and                            oxygen saturations were monitored continuously. The                            [id number] ([id number] scope was introduced through the                            mouth, and advanced to the second part of duodenum.                            The upper GI endoscopy was accomplished without                            difficulty. The patient tolerated the procedure                            well. Scope In: Scope Out: Findings:      LA Grade C (one or more mucosal breaks continuous between tops of 2 or       more mucosal folds, less than 75% circumference) esophagitis with no       bleeding was found in the distal esophagus. Biopsies were taken with a       cold forceps for histology. Verification of patient identification for       the specimen was done. Estimated blood loss was minimal.      A 4 cm hiatal hernia was present.      The cardia and gastric fundus were normal on retroflexion.      Diffuse mildly erythematous mucosa without active bleeding and with no       stigmata of bleeding was found in the duodenal bulb.      The exam was otherwise without abnormality. Impression:               - LA Grade C reflux esophagitis. Biopsied.                           - 4 cm  hiatal hernia.                           - Erythematous duodenopathy.                           - The examination was otherwise normal. Moderate Sedation:      Moderate (conscious) sedation was administered by the endoscopy nurse       and supervised by the endoscopist. The following parameters were       monitored: oxygen saturation, heart rate, blood pressure, and response       to care. Total physician intraservice time was 12 minutes. Recommendation:           - Return patient to hospital ward for ongoing care.                           - Perform an abdominal  ultrasound tomorrow.                           - IV PPI bid for now til sure taking oral ok                           allow clears tonight                           see what US shows - w/ abnormal LFT's and pain ?                            gallstones                           - Clear liquid diet today.                           - Continue present medications.                           - Follow an antireflux regimen. Procedure Code(s):        --- Professional ---                           [phone], Esophagogastroduodenoscopy, flexible,                            transoral; with biopsy, single or multiple Diagnosis Code(s):        --- Professional ---                           K21.0, Gastro-esophageal reflux disease with                            esophagitis                           K44.9, Diaphragmatic hernia without obstruction or  gangrene                           R10.13, Epigastric pain                           R10.11, Right upper quadrant pain                           R93.3, Abnormal findings on diagnostic imaging of                            other parts of digestive tract CPT copyright 2016 [employer]. All rights reserved. The codes documented in this report are preliminary and upon coder review may  be revised to meet current compliance requirements. Iva Boop, MD 10/26/2017 3:22:41 PM This report has been signed electronically. Number of Addenda: 0

## 2017-10-26 NOTE — H&P (Signed)
Bryan Flynn ZOX:[id number] DOB: 1971/07/06 DOA: 10/25/2017     WJX:BJYN  Outpatient Specialists:  none  Patient coming from:    home Lives alone,       Chief Complaint:  Epigastric pain   HPI: Bryan Flynn is a 47 y.o. male with medical history significant of prior esophageal surgery    Presented with three-day history of epigastric pain patient started to have some nausea and vomiting as well as diarrhea X1  4 days ago associated some abdominal cramping he presented to urgent care never had any fevers or chills no blood in his stools no melena. NO BM for the past 4 days Pain has became sharp and stabbing and he presented back to emergency department tried antiacids and Pepto-Bismol but didn't seem to help also noted pain in the right flank no associated chest pain or shortness of breath Reports loss of appetite. He attempted to use Goody powders and BC powder and a helped the pain. He have not had significant weight loss.  Reports taking only 2 doses of tylenol last was 4 days ago.  In emergency department labs were significant for lipase of 40 potassium 3.3  LFT slightly elevated. 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 Arlyce Flynn Has occasional heartburn and takes Zantac   IN ER:  Temp (24hrs), Avg:98.5 F (36.9 C), Min:98.5 F (36.9 C), Max:98.5 F (36.9 C)      on arrival  ED Triage Vitals  Enc Vitals Group     BP 10/25/17 2005 (!) 144/95     Pulse Rate 10/25/17 2005 75     Resp 10/25/17 2005 16     Temp 10/25/17 2005 98.5 F (36.9 C)     Temp src --      SpO2 10/25/17 2005 100 %     Weight --      Height --      Head Circumference --      Peak Flow --      Pain Score 10/25/17 2002 7     Pain Loc --      Pain Edu? --      Excl. in GC? --       Latest 99% HR 70 BP 143/90  Following Medications were ordered in ER: Medications  fentaNYL (SUBLIMAZE) injection 50 mcg (50 mcg Intravenous Given 10/25/17 2046)  fentaNYL (SUBLIMAZE) 100 MCG/2ML injection (not administered)  iopamidol (ISOVUE-300) 61 % injection (100 mLs  Contrast Given 10/25/17 2136)  HYDROmorphone (DILAUDID) injection 1 mg (1 mg Intravenous Given 10/25/17 2339)  ondansetron (ZOFRAN) injection 4 mg (4 mg Intravenous Given 10/25/17 2338)  sodium chloride 0.9 % bolus 1,000 mL (1,000 mLs Intravenous New Bag/Given 10/25/17 2338)  pantoprazole (PROTONIX) injection 40 mg (40 mg Intravenous Given 10/25/17 2339)       Hospitalist was called for admission for epigastric pain with possible esophageal mass  Review of Systems:    Pertinent positives include:  abdominal pain, nausea, vomiting, diarrhea,  Constitutional:  No weight loss, night sweats, Fevers, chills, fatigue, weight loss  HEENT:  No headaches, Difficulty swallowing,Tooth/dental problems,Sore throat,  No sneezing, itching, ear ache, nasal congestion, post nasal drip,  Cardio-vascular:  No chest pain, Orthopnea, PND, anasarca, dizziness, palpitations.no Bilateral lower extremity swelling  GI:  No heartburn, indigestion, change in bowel  habits, loss of appetite, melena, blood in stool, hematemesis Resp:  no shortness of breath at rest. No dyspnea on exertion, No excess mucus, no productive cough, No non-productive cough, No coughing up of blood.No change in color of mucus.No wheezing. Skin:  no rash or lesions. No jaundice GU:  no dysuria, change in color of urine, no urgency or frequency. No straining to urinate.  No flank pain.  Musculoskeletal:  No joint pain or no joint swelling. No decreased range of motion. No back pain.  Psych:  No change in mood or affect. No depression or anxiety. No memory loss.  Neuro: no localizing neurological complaints, no tingling, no weakness, no double vision, no gait  abnormality, no slurred speech, no confusion  As per HPI otherwise 10 point review of systems negative.   Past Medical History: Past Medical History:  Diagnosis Date  . Asthma 10/26/2017   Past Surgical History:  Procedure Laterality Date  . THROAT SURGERY       Social History:  Ambulatory   independently      reports that he has been smoking.  he has never used smokeless tobacco. He reports that he does not drink alcohol or use drugs.  Allergies:  No Known Allergies     Family History:   Family History  Problem Relation Age of Onset  . Lung cancer Other   . Diabetes Neg Hx   . CAD Neg Hx   . Stroke Neg Hx   . Hypertension Neg Hx     Medications: Prior to Admission medications   Medication Sig Start Date End Date Taking? Authorizing Provider  ondansetron (ZOFRAN) 4 MG tablet Take 1 tablet (4 mg total) by mouth every 8 (eight) hours as needed for nausea or vomiting. 10/23/17  Yes Young, Dillard Cannon, PA-C  Aspirin-Acetaminophen-Caffeine (GOODYS EXTRA STRENGTH PO) Take 1-2 packets by mouth every 6 (six) hours as needed (for pain).    [provider]  potassium chloride SA (K-DUR,KLOR-CON) 20 MEQ tablet Take 1 tablet (20 mEq total) by mouth daily. Patient not taking: Reported on 10/25/2017 09/28/16   Pisciotta, Joni Reining, PA-C    Physical Exam: Patient Vitals for the past 24 hrs:  BP Temp Pulse Resp SpO2  10/25/17 2300 (!) 143/90 - 70 - 99 %  10/25/17 2218 130/78 - 75 18 97 %  10/25/17 2005 (!) 144/95 98.5 F (36.9 C) 75 16 100 %    1. General:  in No Acute distress  acutely ill -appearing 2. Psychological: Alert and   Oriented 3. Head/ENT:     Dry Mucous Membranes                          Head Non traumatic, neck supple                            Poor Dentition 4. SKIN:   decreased Skin turgor,  Skin clean Dry and intact no rash 5. Heart: Regular rate and rhythm no Murmur, no Rub or gallop 6. Lungs:  Clear to auscultation bilaterally, no wheezes or  crackles   7. Abdomen: Soft, significant epigastric and right upper quadrant tenderness, Non distended  8. Lower extremities: no clubbing, cyanosis, or edema 9. Neurologically Grossly intact, moving all 4 extremities equally  10. MSK: Normal range of motion   body mass index is unknown because there is no height or weight on file.  Labs on Admission:  Labs on Admission: I have personally reviewed following labs and imaging studies  CBC: Recent Labs  Lab 10/25/17 2008  WBC 13.0*  HGB 16.2  HCT 45.8  MCV 90.9  PLT 276   Basic Metabolic Panel: Recent Labs  Lab 10/25/17 2008  NA 137  K 3.3*  CL 100*  CO2 24  GLUCOSE 106*  BUN 6  CREATININE 1.03  CALCIUM 9.3   GFR: CrCl cannot be calculated (Unknown ideal weight.). Liver Function Tests: Recent Labs  Lab 10/25/17 2008  AST 86*  ALT 242*  ALKPHOS 162*  BILITOT 1.2  PROT 7.5  ALBUMIN 3.9   Recent Labs  Lab 10/25/17 2008  LIPASE 40   No results for input(s): AMMONIA in the last 168 hours. Coagulation Profile: No results for input(s): INR, PROTIME in the last 168 hours. Cardiac Enzymes: No results for input(s): CKTOTAL, CKMB, CKMBINDEX, TROPONINI in the last 168 hours. BNP (last 3 results) No results for input(s): PROBNP in the last 8760 hours. HbA1C: No results for input(s): HGBA1C in the last 72 hours. CBG: No results for input(s): GLUCAP in the last 168 hours. Lipid Profile: No results for input(s): CHOL, HDL, LDLCALC, TRIG, CHOLHDL, LDLDIRECT in the last 72 hours. Thyroid Function Tests: No results for input(s): TSH, T4TOTAL, FREET4, T3FREE, THYROIDAB in the last 72 hours. Anemia Panel: No results for input(s): VITAMINB12, FOLATE, FERRITIN, TIBC, IRON, RETICCTPCT in the last 72 hours. Urine analysis:    Component Value Date/Time   COLORURINE YELLOW 10/25/2017 2008   APPEARANCEUR CLEAR 10/25/2017 2008   LABSPEC 1.015 10/25/2017 2008   PHURINE 6.0 10/25/2017 2008   GLUCOSEU NEGATIVE 10/25/2017  2008   HGBUR SMALL (A) 10/25/2017 2008   BILIRUBINUR NEGATIVE 10/25/2017 2008   KETONESUR NEGATIVE 10/25/2017 2008   PROTEINUR NEGATIVE 10/25/2017 2008   NITRITE NEGATIVE 10/25/2017 2008   LEUKOCYTESUR TRACE (A) 10/25/2017 2008   Sepsis Labs: @LABRCNTIP (procalcitonin:4,lacticidven:4) )No results found for this or any previous visit (from the past 240 hour(s)).     UA  no evidence of UTI     No results found for: HGBA1C  CrCl cannot be calculated (Unknown ideal weight.).  BNP (last 3 results) No results for input(s): PROBNP in the last 8760 hours.   ECG REPORT Not obtained  There were no vitals filed for this visit.   Cultures: No results found for: SDES, SPECREQUEST, CULT, REPTSTATUS   Radiological Exams on Admission: Ct Abdomen Pelvis W Contrast  Result Date: 10/25/2017 CLINICAL DATA:  Acute onset of umbilical abdominal pain and nausea. EXAM: CT ABDOMEN AND PELVIS WITH CONTRAST TECHNIQUE: Multidetector CT imaging of the abdomen and pelvis was performed using the standard protocol following bolus administration of intravenous contrast. CONTRAST:  100 mL ISOVUE-300 IOPAMIDOL (ISOVUE-300) INJECTION 61% COMPARISON:  None. FINDINGS: Lower chest: The visualized lung bases are grossly clear. The visualized portions of the mediastinum are unremarkable. There is focal wall thickening and minimal calcification at the distal esophagus, with a few adjacent nodes measuring up to 7 mm in short axis. A 9 mm node is noted at the gastroesophageal junction. Malignancy is a concern. Hepatobiliary: The liver is unremarkable in appearance. The gallbladder is unremarkable in appearance. The common bile duct remains normal in caliber. Pancreas: The pancreas is within normal limits. Spleen: The spleen is unremarkable in appearance. Adrenals/Urinary Tract: The adrenal glands are unremarkable in appearance. The kidneys are within normal limits. There is no evidence of hydronephrosis. No renal or ureteral  stones are identified. No perinephric stranding is  seen. Stomach/Bowel: The stomach is unremarkable in appearance. The small bowel is within normal limits. The patient is status post appendectomy. The colon is unremarkable in appearance. Vascular/Lymphatic: Mild scattered calcification is seen along the distal abdominal aorta and its branches. A retroaortic left renal vein is noted. The inferior vena cava is grossly unremarkable. No retroperitoneal lymphadenopathy is seen. No pelvic sidewall lymphadenopathy is identified. Reproductive: The bladder is mildly distended and grossly unremarkable. The prostate is normal in size. Other: No additional soft tissue abnormalities are seen. Musculoskeletal: No acute osseous abnormalities are identified. The visualized musculature is unremarkable in appearance. IMPRESSION: 1. No acute abnormality seen to explain the patient's symptoms. 2. Focal wall thickening and minimal calcification at the distal esophagus, with a few adjacent nodes measuring up to 7 mm in short axis. 9 mm node noted at the gastroesophageal junction. This raises concern for malignancy. Endoscopy is recommended for further evaluation, when and as deemed clinically appropriate. Aortic Atherosclerosis (ICD10-I70.0). These results were called by telephone at the time of interpretation on 10/25/2017 at 10:17 pm to Dr. Margarita Grizzle, who verbally acknowledged these results. Electronically Signed   By: Roanna Raider M.D.   On: 10/25/2017 22:19    Chart has been reviewed    Assessment/Plan  47 y.o. male with medical history significant of prior esophageal surgery   Admitted for significant epigastric pain with possible esophageal mass and elevated LFTs  Present on Admission:  . Acute epigastric pain admitted for rehydration pain management will need GI consult in a.m. for now keep nothing by mouth continue Protonix . Elevated LFTs - order hepatitis panel, acetaminophen level patient denies alcohol abuse  could be possibly secondary to dehydration and/or recent viral illness . Hypokalemia will replace magnesium  Is with in normal limits Dehydration will rehydrate patient clinically appears to be dry   Other plan as per orders.  DVT prophylaxis:  SCD      Code Status:  FULL CODE  as per patient    Family Communication:   Family  at  Bedside  plan of care was discussed with   Wife  Disposition Plan:    To home once workup is complete and patient is stable                                                   Consults called: none  Admission status:   obs   Level of care    medical floor            I have spent a total of 56 min on this admission  Bryan Flynn 10/26/2017, 2:20 AM    [facility]  Pager [phone]   after 2 AM please page floor coverage PA If 7AM-7PM, please contact the day team taking care of the patient  [email or web address]  Password [email or web address]

## 2017-10-26 NOTE — Progress Notes (Signed)
Received patient from ED accompanied by wife. Patient AOx4, ambulatory, VS stable, O2Sat at 96% on RA and c/o pain at 7/10.  Oriented to room, bed controls and call light.  On call [facility] floor coverage for PRN pain medication since only Tylenol is prescribe for PRN pain medication and patient is NPO.  Awaiting orders.

## 2017-10-26 NOTE — Consult Note (Signed)
Consultation  Referring Provider: Dr. Therisa DoyneAnastassia Flynn     Primary Care Physician:  Patient, No Pcp Per Primary Gastroenterologist: Dr. Barnet Pallob Kaplan        past Reason for Consultation:  Epigastric pain/mass on CT        Impression / Plan:   1. Acute epigastric pain- will need EGD given CT findings and pt presentation, discussed this with pt, he is agreeable. Continue protonix and npo.   2. Elevated LFTs- hepatitis panel pending. No etoh use per patient.   3. Elevated WBC- possible d/t dehydration and pain. Does not appear toxic, mild elevation, will monitor.   4. Hypokalemia- likely d/t dehydration, resolved.   I have personally seen the patient, reviewed and repeated key elements of the history and physical and participated in formation of the assessment and plan the student has documented.   He needs an EGD but do not think we have explained his pain and tenderness in upper abdomen + abnl LFT's EGD today and US tomorrow.   The risks and benefits as well as alternatives of endoscopic procedure(s) have been discussed and reviewed. All questions answered. The patient agrees to proceed.  I appreciate the opportunity to care for you. Bryan Booparl E. Gessner, MD, Clementeen GrahamFACG         HPI:   Bryan Flynn is a 47 y.o. male who presented to the ED on 2/11 with 3 day hx of progressive epigastric pain. He reported nausea, vomiting, and one episode of diarrhea approx 4 days ago along with abdominal cramping. He reported he has not had BMs since then. Abd pain was sharp, stabbing, some pain in the R flank. Antacids, pepto bismol provided no releif, goody powders and BC powder provided some relief. He did not report any fever, chills, melena, hematochezia, hematemesis, weight loss. CT abdomen showed focal wall thickening, minimal calcifications at distal esophagus with possibility of malignancy. He has a hx of mallory-weiss tear, repaired via EGD in 2002 by Dr. Arlyce DiceKaplan. He has occasional heartburn for  which he takes zantac. Medical hx only positive for asthma. He is a current every day smoker, 1/2 ppd x 25 years. Does not drink etoh, UDS positive for opiates and THC.  Today he reports he is not having as much pain, he received dilaudid approx 30 mins ago with relief of abdominal pain. He is currently not having any nausea, vomiting, or BMs. Denies SOB, CP, fevers, chills. He is thirsty and would like to know if he could have some water or ice chips if he doesn't get his EGD today.    Past Medical History:  Diagnosis Date  . Asthma 10/26/2017  . GERD (gastroesophageal reflux disease)   . Heart murmur     Past Surgical History:  Procedure Laterality Date  . ESOPHAGOGASTRODUODENOSCOPY  2002   mallory weiss tear.      Family History  Problem Relation Age of Onset  . Lung cancer Other   . Diabetes Neg Hx   . CAD Neg Hx   . Stroke Neg Hx   . Hypertension Neg Hx      Social History   Tobacco Use  . Smoking status: Current Every Day Smoker    Packs/day: 0.50    Years: 25.00    Pack years: 12.50    Types: Cigarettes  . Smokeless tobacco: Never Used  Substance Use Topics  . Alcohol use: Yes    Comment: occasional  . Drug use: Yes    Types: Marijuana  Prior to Admission medications   Medication Sig Start Date End Date Taking? Authorizing Provider  ondansetron (ZOFRAN) 4 MG tablet Take 1 tablet (4 mg total) by mouth every 8 (eight) hours as needed for nausea or vomiting. 10/23/17  Yes Young, Dillard Cannon, PA-C  Aspirin-Acetaminophen-Caffeine (GOODYS EXTRA STRENGTH PO) Take 1-2 packets by mouth every 6 (six) hours as needed (for pain).    [provider]  potassium chloride SA (K-DUR,KLOR-CON) 20 MEQ tablet Take 1 tablet (20 mEq total) by mouth daily. Patient not taking: Reported on 10/25/2017 09/28/16   Pisciotta, Joni Reining, PA-C    Current Facility-Administered Medications  Medication Dose Route Frequency Provider Last Rate Last Dose  . 0.9 %  sodium chloride infusion    Intravenous Continuous Bryan Doyne, MD 125 mL/hr at 10/26/17 0435    . HYDROmorphone (DILAUDID) injection 1 mg  1 mg Intravenous Q2H PRN Marlin Canary U, DO   1 mg at 10/26/17 1052  . ondansetron (ZOFRAN) tablet 4 mg  4 mg Oral Q6H PRN Flynn, Anastassia, MD       Or  . ondansetron (ZOFRAN) injection 4 mg  4 mg Intravenous Q6H PRN Flynn, Anastassia, MD   4 mg at 10/26/17 0245  . pantoprazole (PROTONIX) injection 40 mg  40 mg Intravenous Q12H Flynn, Anastassia, MD   40 mg at 10/26/17 1053    Allergies as of 10/25/2017  . (No Known Allergies)     Review of Systems:    This is positive for those things mentioned in the HPI. All other review of systems are negative.      Physical Exam:  Vital signs in last 24 hours: Temp:  [97.8 F (36.6 C)-98.5 F (36.9 C)] 97.8 F (36.6 C) (02/12 0343) Pulse Rate:  [61-75] 72 (02/12 0343) Resp:  [16-18] 18 (02/11 2218) BP: (130-147)/(75-95) 139/85 (02/12 0343) SpO2:  [93 %-100 %] 96 % (02/12 0343) Weight:  [81.4 kg (179 lb 7.3 oz)] 81.4 kg (179 lb 7.3 oz) (02/12 0343) Last BM Date: 10/21/17  General:  Well-developed, well-nourished and in no acute distress Eyes:  anicteric. ENT:   Mouth and posterior pharynx free of lesions.  Lungs: Clear to auscultation bilaterally. Heart:     S1S2, no rubs, murmurs, gallops. Abdomen:  soft, no hepatosplenomegaly, hernia, or mass. Decreased bowel sounds, tenderness in epigastric region without rebound or guarding.  Extremities:   no edema Skin   no rash. Neuro:  A&O x 3.  Psych:  appropriate mood and affect.   Data Reviewed: Labs, inpatient notes, meds, vitals, imaging.    LAB RESULTS: Recent Labs    10/25/17 2008 10/26/17 0253  WBC 13.0* 13.8*  HGB 16.2 15.5  HCT 45.8 44.6  PLT 276 286   BMET Recent Labs    10/25/17 2008 10/26/17 0253  NA 137 136  K 3.3* 3.7  CL 100* 103  CO2 24 22  GLUCOSE 106* 120*  BUN 6 <5*  CREATININE 1.03 0.93  CALCIUM 9.3 8.9   LFT Recent  Labs    10/26/17 0253  PROT 7.0  ALBUMIN 3.8  AST 128*  ALT 249*  ALKPHOS 196*  BILITOT 2.1*   PT/INR No results for input(s): LABPROT, INR in the last 72 hours.  STUDIES: Ct Abdomen Pelvis W Contrast  Result Date: 10/25/2017 CLINICAL DATA:  Acute onset of umbilical abdominal pain and nausea. EXAM: CT ABDOMEN AND PELVIS WITH CONTRAST TECHNIQUE: Multidetector CT imaging of the abdomen and pelvis was performed using the standard protocol following bolus  administration of intravenous contrast. CONTRAST:  100 mL ISOVUE-300 IOPAMIDOL (ISOVUE-300) INJECTION 61% COMPARISON:  None. FINDINGS: Lower chest: The visualized lung bases are grossly clear. The visualized portions of the mediastinum are unremarkable. There is focal wall thickening and minimal calcification at the distal esophagus, with a few adjacent nodes measuring up to 7 mm in short axis. A 9 mm node is noted at the gastroesophageal junction. Malignancy is a concern. Hepatobiliary: The liver is unremarkable in appearance. The gallbladder is unremarkable in appearance. The common bile duct remains normal in caliber. Pancreas: The pancreas is within normal limits. Spleen: The spleen is unremarkable in appearance. Adrenals/Urinary Tract: The adrenal glands are unremarkable in appearance. The kidneys are within normal limits. There is no evidence of hydronephrosis. No renal or ureteral stones are identified. No perinephric stranding is seen. Stomach/Bowel: The stomach is unremarkable in appearance. The small bowel is within normal limits. The patient is status post appendectomy. The colon is unremarkable in appearance. Vascular/Lymphatic: Mild scattered calcification is seen along the distal abdominal aorta and its branches. A retroaortic left renal vein is noted. The inferior vena cava is grossly unremarkable. No retroperitoneal lymphadenopathy is seen. No pelvic sidewall lymphadenopathy is identified. Reproductive: The bladder is mildly distended  and grossly unremarkable. The prostate is normal in size. Other: No additional soft tissue abnormalities are seen. Musculoskeletal: No acute osseous abnormalities are identified. The visualized musculature is unremarkable in appearance. IMPRESSION: 1. No acute abnormality seen to explain the patient's symptoms. 2. Focal wall thickening and minimal calcification at the distal esophagus, with a few adjacent nodes measuring up to 7 mm in short axis. 9 mm node noted at the gastroesophageal junction. This raises concern for malignancy. Endoscopy is recommended for further evaluation, when and as deemed clinically appropriate. Aortic Atherosclerosis (ICD10-I70.0). These results were called by telephone at the time of interpretation on 10/25/2017 at 10:17 pm to Dr. Margarita Grizzle, who verbally acknowledged these results. Electronically Signed   By: Roanna Raider M.D.   On: 10/25/2017 22:19    PREVIOUS ENDOSCOPIES:            2002- Mallory-weiss tear repair via EGD by Dr. Arlyce Dice   Thanks   LOS: 0 days   @Bryan  Sena Slate, MD, [facility] @  10/26/2017, 11:15 AM

## 2017-10-26 NOTE — Progress Notes (Signed)
Administered Dilaudid 1 mg Inj once per order to patient with pain at 9/10 and noted effective.  Explained to patient that the Dilaudid is just once and subsequent PRN pain medication will be handled by day shift [facility].

## 2017-10-26 NOTE — ED Notes (Signed)
RN attempted to call report; RN to call back-Monique,RN  

## 2017-10-26 NOTE — Progress Notes (Signed)
Patient woke up, moaning & c/o pain at 10/10. Patient stated that he cannot wait for 2 more hours for a pain medication.  Patient went to bathroom to void and back to bed still moaning for pain.  [facility] floor coverage notified.  Awaiting orders.

## 2017-10-27 ENCOUNTER — Encounter (HOSPITAL_COMMUNITY): Payer: Self-pay | Admitting: Internal Medicine

## 2017-10-27 ENCOUNTER — Inpatient Hospital Stay (HOSPITAL_COMMUNITY): Payer: Commercial Managed Care - PPO

## 2017-10-27 DIAGNOSIS — K810 Acute cholecystitis: Secondary | ICD-10-CM

## 2017-10-27 DIAGNOSIS — K8042 Calculus of bile duct with acute cholecystitis without obstruction: Secondary | ICD-10-CM

## 2017-10-27 LAB — CBC
HEMATOCRIT: 40.4 % (ref 39.0–52.0)
HEMOGLOBIN: 13.8 g/dL (ref 13.0–17.0)
MCH: 31.2 pg (ref 26.0–34.0)
MCHC: 34.2 g/dL (ref 30.0–36.0)
MCV: 91.4 fL (ref 78.0–100.0)
PLATELETS: 266 10*3/uL (ref 150–400)
RBC: 4.42 MIL/uL (ref 4.22–5.81)
RDW: 13.5 % (ref 11.5–15.5)
WBC: 13.7 10*3/uL — AB (ref 4.0–10.5)

## 2017-10-27 LAB — COMPREHENSIVE METABOLIC PANEL
ALK PHOS: 222 U/L — AB (ref 38–126)
ALT: 198 U/L — AB (ref 17–63)
AST: 95 U/L — ABNORMAL HIGH (ref 15–41)
Albumin: 3.3 g/dL — ABNORMAL LOW (ref 3.5–5.0)
Anion gap: 9 (ref 5–15)
BUN: 5 mg/dL — ABNORMAL LOW (ref 6–20)
CALCIUM: 8.7 mg/dL — AB (ref 8.9–10.3)
CHLORIDE: 104 mmol/L (ref 101–111)
CO2: 23 mmol/L (ref 22–32)
CREATININE: 0.76 mg/dL (ref 0.61–1.24)
Glucose, Bld: 113 mg/dL — ABNORMAL HIGH (ref 65–99)
Potassium: 3.4 mmol/L — ABNORMAL LOW (ref 3.5–5.1)
Sodium: 136 mmol/L (ref 135–145)
Total Bilirubin: 3.6 mg/dL — ABNORMAL HIGH (ref 0.3–1.2)
Total Protein: 6.2 g/dL — ABNORMAL LOW (ref 6.5–8.1)

## 2017-10-27 SURGERY — EGD (ESOPHAGOGASTRODUODENOSCOPY)
Anesthesia: Monitor Anesthesia Care

## 2017-10-27 MED ORDER — HYDROXYZINE HCL 10 MG PO TABS
10.0000 mg | ORAL_TABLET | Freq: Three times a day (TID) | ORAL | Status: DC | PRN
  Administered 2017-10-27: 10 mg via ORAL
  Filled 2017-10-27 (×3): qty 1

## 2017-10-27 MED ORDER — OXYCODONE HCL 5 MG PO TABS
5.0000 mg | ORAL_TABLET | ORAL | Status: DC | PRN
  Administered 2017-10-27 (×2): 5 mg via ORAL
  Filled 2017-10-27 (×2): qty 1

## 2017-10-27 MED ORDER — PIPERACILLIN-TAZOBACTAM 3.375 G IVPB: 3.3750 g | Freq: Four times a day (QID) | INTRAVENOUS | Status: DC

## 2017-10-27 MED ORDER — SODIUM CHLORIDE 0.9 % IV SOLN: INTRAVENOUS | Status: DC

## 2017-10-27 MED ORDER — HYDROMORPHONE HCL 1 MG/ML IJ SOLN
1.0000 mg | INTRAMUSCULAR | Status: DC | PRN
  Administered 2017-10-27 – 2017-10-28 (×6): 1 mg via INTRAVENOUS
  Filled 2017-10-27 (×6): qty 1

## 2017-10-27 MED ORDER — CAMPHOR-MENTHOL 0.5-0.5 % EX LOTN
TOPICAL_LOTION | CUTANEOUS | Status: DC | PRN
  Administered 2017-10-27: 1 via TOPICAL
  Filled 2017-10-27 (×2): qty 222

## 2017-10-27 MED ORDER — PIPERACILLIN-TAZOBACTAM 3.375 G IVPB 30 MIN
3.3750 g | Freq: Three times a day (TID) | INTRAVENOUS | Status: DC
  Filled 2017-10-27: qty 50

## 2017-10-27 MED ORDER — PIPERACILLIN-TAZOBACTAM 3.375 G IVPB
3.3750 g | Freq: Three times a day (TID) | INTRAVENOUS | Status: DC
  Administered 2017-10-27 – 2017-10-30 (×8): 3.375 g via INTRAVENOUS
  Filled 2017-10-27 (×9): qty 50

## 2017-10-27 MED ORDER — POTASSIUM CHLORIDE IN NACL 20-0.9 MEQ/L-% IV SOLN
INTRAVENOUS | Status: DC
  Administered 2017-10-27 – 2017-10-29 (×6): via INTRAVENOUS
  Filled 2017-10-27 (×6): qty 1000

## 2017-10-27 MED ORDER — PIPERACILLIN-TAZOBACTAM 3.375 G IVPB
3.3750 g | Freq: Three times a day (TID) | INTRAVENOUS | Status: DC
  Filled 2017-10-27 (×2): qty 50

## 2017-10-27 MED ORDER — ZOLPIDEM TARTRATE 5 MG PO TABS
5.0000 mg | ORAL_TABLET | Freq: Once | ORAL | Status: AC
  Administered 2017-10-27: 5 mg via ORAL
  Filled 2017-10-27: qty 1

## 2017-10-27 NOTE — Consult Note (Signed)
[facility] Consult/Admission Note  Bryan Flynn 1971-01-20  [id number].    Requesting MD: Dr. Eliseo Squires Chief Complaint/Reason for Consult: cholecystitis  HPI:   Pt is a 47 yo male with a history of GERD, mallory weiss tear in 2002, who presented to the ED with complaints of epigastric abdominal pain with nausea and vomiting. Pain started early morning of 02/08 and has been constant since. Went to urgent care who diagnosed him with a viral GI bug. Pt then presented to ED 2 days ago cause pain did not improve. Pain waxes and wanes in severity, heating pad helped, sharp at times, in the upper abdomen with some radiating into his back. Associated nausea and vomiting and few chills. Diarrhea on Friday but this has resolved. Pt denies CP, SOB, blood in vomit, blood in stool, fever. Pt denies previous abdominal surgeries or anticoagulation.  US showed Cholelithiasis with wall thickening and positive sonographic Murphy sign, findings indicative of acute cholecystitis. Elevated LFT's and rising Tbili, 3.6 today. WBC 13.7. CT scan was concerning for esophageal mass so GI did EGD today. They are planning an ERCP tomorrow.   ROS:  Review of Systems  Constitutional: Positive for chills. Negative for diaphoresis and fever.  HENT: Negative for sore throat.   Respiratory: Negative for cough and shortness of breath.   Cardiovascular: Negative for chest pain.  Gastrointestinal: Positive for abdominal pain, diarrhea, nausea and vomiting. Negative for blood in stool and constipation.  Genitourinary: Negative for dysuria.  Skin: Negative for rash.  Neurological: Negative for dizziness and loss of consciousness.  All other systems reviewed and are negative.    Family History  Problem Relation Age of Onset  . Lung cancer Other   . Diabetes Neg Hx   . CAD Neg Hx   . Stroke Neg Hx   . Hypertension Neg Hx     Past Medical History:  Diagnosis Date  . Asthma 10/26/2017  . GERD  (gastroesophageal reflux disease) 10/2017  . Heart murmur     Past Surgical History:  Procedure Laterality Date  . ESOPHAGOGASTRODUODENOSCOPY  2002   mallory weiss tear.    . ESOPHAGOGASTRODUODENOSCOPY  10/26/2017   for abnormal appearance to esophagus on CT.  Dr Carlean Purl.  Findings:  LA grade C reflux esophagitis, duodenal erythema, HH.      Social History:  reports that he has been smoking cigarettes.  He has a 12.50 pack-year smoking history. he has never used smokeless tobacco. He reports that he drinks alcohol. He reports that he uses drugs. Drug: Marijuana.  Allergies: No Known Allergies  Medications Prior to Admission  Medication Sig Dispense Refill  . ondansetron (ZOFRAN) 4 MG tablet Take 1 tablet (4 mg total) by mouth every 8 (eight) hours as needed for nausea or vomiting. 12 tablet 0  . Aspirin-Acetaminophen-Caffeine (GOODYS EXTRA STRENGTH PO) Take 1-2 packets by mouth every 6 (six) hours as needed (for pain).    . potassium chloride SA (K-DUR,KLOR-CON) 20 MEQ tablet Take 1 tablet (20 mEq total) by mouth daily. (Patient not taking: Reported on 10/25/2017) 3 tablet 0    Blood pressure 122/77, pulse 71, temperature 97.9 F (36.6 C), temperature source Oral, resp. rate 18, height 5' 11"  (1.803 m), weight 179 lb (81.2 kg), SpO2 97 %.  Physical Exam  Constitutional: He is oriented to person, place, and time and well-developed, well-nourished, and in no distress. No distress.  HENT:  Head: Normocephalic and atraumatic.  Nose: Nose normal.  Mouth/Throat: Oropharynx is clear and moist.  No oropharyngeal exudate.  Eyes: Conjunctivae are normal. Right eye exhibits no discharge. Left eye exhibits no discharge. No scleral icterus.  Pupils are equal and round  Neck: Normal range of motion. Neck supple. No thyromegaly present.  Cardiovascular: Normal rate, regular rhythm, normal heart sounds and intact distal pulses.  No murmur heard. Pulses:      Radial pulses are 2+ on the right  side, and 2+ on the left side.  Pulmonary/Chest: Effort normal and breath sounds normal. No respiratory distress. He has no wheezes. He has no rhonchi. He has no rales.  Abdominal: Soft. Normal appearance. He exhibits no distension. Bowel sounds are hypoactive. There is no hepatosplenomegaly. There is tenderness in the right upper quadrant and epigastric area. There is guarding (mild). There is no rigidity.  Musculoskeletal: Normal range of motion. He exhibits no edema, tenderness or deformity.  Lymphadenopathy:    He has no cervical adenopathy.  Neurological: He is alert and oriented to person, place, and time.  Skin: Skin is warm and dry. No rash noted. He is not diaphoretic.  Psychiatric: Mood and affect normal.  Nursing note and vitals reviewed.   Results for orders placed or performed during the hospital encounter of 10/25/17 (from the past 48 hour(s))  Lipase, blood     Status: None   Collection Time: 10/25/17  8:08 PM  Result Value Ref Range   Lipase 40 11 - 51 U/L    Comment: Performed at [facility], [place] [address], [place], [place] [address]  Comprehensive metabolic panel     Status: Abnormal   Collection Time: 10/25/17  8:08 PM  Result Value Ref Range   Sodium 137 135 - 145 mmol/L   Potassium 3.3 (L) 3.5 - 5.1 mmol/L   Chloride 100 (L) 101 - 111 mmol/L   CO2 24 22 - 32 mmol/L   Glucose, Bld 106 (H) 65 - 99 mg/dL   BUN 6 6 - 20 mg/dL   Creatinine, Ser 1.03 0.61 - 1.24 mg/dL   Calcium 9.3 8.9 - 10.3 mg/dL   Total Protein 7.5 6.5 - 8.1 g/dL   Albumin 3.9 3.5 - 5.0 g/dL   AST 86 (H) 15 - 41 U/L   ALT 242 (H) 17 - 63 U/L   Alkaline Phosphatase 162 (H) 38 - 126 U/L   Total Bilirubin 1.2 0.3 - 1.2 mg/dL   GFR calc non Af Amer >60 >60 mL/min   GFR calc Af Amer >60 >60 mL/min    Comment: (NOTE) The eGFR has been calculated using the CKD EPI equation. This calculation has not been validated in all clinical situations. eGFR's persistently <60 mL/min signify possible  Chronic Kidney Disease.    Anion gap 13 5 - 15    Comment: Performed at [facility] [address] [place], [place] [address]  CBC     Status: Abnormal   Collection Time: 10/25/17  8:08 PM  Result Value Ref Range   WBC 13.0 (H) 4.0 - 10.5 K/uL   RBC 5.04 4.22 - 5.81 MIL/uL   Hemoglobin 16.2 13.0 - 17.0 g/dL   HCT 45.8 39.0 - 52.0 %   MCV 90.9 78.0 - 100.0 fL   MCH 32.1 26.0 - 34.0 pg   MCHC 35.4 30.0 - 36.0 g/dL   RDW 13.0 11.5 - 15.5 %   Platelets 276 150 - 400 K/uL    Comment: Performed at [facility], [place] [address] [place], [place] [address]  Urinalysis, Routine w reflex microscopic     Status: Abnormal   Collection Time: 10/25/17  8:08 PM  Result Value Ref Range   Color, Urine YELLOW YELLOW   APPearance CLEAR CLEAR   Specific Gravity, Urine 1.015 1.005 - 1.030   pH 6.0 5.0 - 8.0   Glucose, UA NEGATIVE NEGATIVE mg/dL   Hgb urine dipstick SMALL (A) NEGATIVE   Bilirubin Urine NEGATIVE NEGATIVE   Ketones, ur NEGATIVE NEGATIVE mg/dL   Protein, ur NEGATIVE NEGATIVE mg/dL   Nitrite NEGATIVE NEGATIVE   Leukocytes, UA TRACE (A) NEGATIVE   RBC / HPF 6-30 0 - 5 RBC/hpf   WBC, UA 6-30 0 - 5 WBC/hpf   Bacteria, UA RARE (A) NONE SEEN   Squamous Epithelial / LPF 0-5 (A) NONE SEEN   Mucus PRESENT     Comment: Performed at [facility], [address] [place], [place] [address]  CK     Status: None   Collection Time: 10/25/17  8:08 PM  Result Value Ref Range   Total CK 54 49 - 397 U/L    Comment: Performed at [facility] [address], [place], [place] [address]  Magnesium     Status: None   Collection Time: 10/25/17  8:08 PM  Result Value Ref Range   Magnesium 2.1 1.7 - 2.4 mg/dL    Comment: Performed at [facility], [place] [address] [place], [place] [address]  Phosphorus     Status: None   Collection Time: 10/25/17  8:08 PM  Result Value Ref Range   Phosphorus 3.5 2.5 - 4.6 mg/dL    Comment: Performed at [facility], [place]  [address], [place],  [address]  Urine rapid drug screen (hosp performed)     Status: Abnormal   Collection Time: 10/26/17  2:11 AM  Result Value Ref Range   Opiates POSITIVE (A) NONE DETECTED   Cocaine NONE DETECTED NONE DETECTED   Benzodiazepines NONE DETECTED NONE DETECTED   Amphetamines NONE DETECTED NONE DETECTED   Tetrahydrocannabinol POSITIVE (A) NONE DETECTED   Barbiturates NONE DETECTED NONE DETECTED    Comment: (NOTE) DRUG SCREEN FOR MEDICAL PURPOSES ONLY.  IF CONFIRMATION IS NEEDED FOR ANY PURPOSE, NOTIFY LAB WITHIN 5 DAYS. LOWEST DETECTABLE LIMITS FOR URINE DRUG SCREEN Drug Class                     Cutoff (ng/mL) Amphetamine and metabolites    1000 Barbiturate and metabolites    200 Benzodiazepine                 102 Tricyclics and metabolites     300 Opiates and metabolites        300 Cocaine and metabolites        300 THC                            50 Performed at [facility], [place] [address] [place], [place] [address]   Acetaminophen level     Status: Abnormal   Collection Time: 10/26/17  2:35 AM  Result Value Ref Range   Acetaminophen (Tylenol), Serum <10 (L) 10 - 30 ug/mL    Comment:        THERAPEUTIC CONCENTRATIONS VARY SIGNIFICANTLY. A RANGE OF 10-30 ug/mL MAY BE AN EFFECTIVE CONCENTRATION FOR MANY PATIENTS. HOWEVER, SOME ARE BEST TREATED AT CONCENTRATIONS OUTSIDE THIS RANGE. ACETAMINOPHEN CONCENTRATIONS >150 ug/mL  AT 4 HOURS AFTER INGESTION AND >50 ug/mL AT 12 HOURS AFTER INGESTION ARE OFTEN ASSOCIATED WITH TOXIC REACTIONS. Performed at [facility], [place] [address], [place], [place] [address]   HIV antibody (Routine Testing)     Status: None   Collection Time: 10/26/17  2:53 AM  Result Value Ref Range   HIV Screen 4th Generation wRfx Non Reactive Non Reactive    Comment: (NOTE) Performed At: [facility] [address] [place], [place] [id number] Rush Farmer MD [id number] Performed at [facility], [place] [address], [place], [place] [address]   Magnesium     Status: None   Collection Time: 10/26/17  2:53 AM  Result Value Ref Range   Magnesium 2.0 1.7 - 2.4 mg/dL    Comment: Performed at [facility], [place] [address], [place], [place] [address]  Phosphorus     Status: None   Collection Time: 10/26/17  2:53 AM  Result Value Ref Range   Phosphorus 2.9 2.5 - 4.6 mg/dL    Comment: Performed at [facility] [address], [place], [place] [address]  TSH     Status: None   Collection Time: 10/26/17  2:53 AM  Result Value Ref Range   TSH 4.044 0.350 - 4.500 uIU/mL    Comment: Performed by a 3rd Generation assay with a functional sensitivity of <=0.01 uIU/mL. Performed at [facility], [place] [address] [place], [place] [address]   Comprehensive metabolic panel     Status: Abnormal   Collection Time: 10/26/17  2:53 AM  Result Value Ref Range   Sodium 136 135 - 145 mmol/L   Potassium 3.7 3.5 - 5.1 mmol/L   Chloride 103 101 - 111 mmol/L   CO2 22 22 - 32 mmol/L   Glucose, Bld 120 (H) 65 - 99 mg/dL   BUN <5 (L) 6 - 20 mg/dL   Creatinine, Ser 0.93 0.61 - 1.24 mg/dL   Calcium 8.9 8.9 - 10.3 mg/dL   Total Protein 7.0 6.5 - 8.1 g/dL   Albumin 3.8 3.5 - 5.0 g/dL   AST 128 (H) 15 - 41 U/L   ALT 249 (H) 17 - 63 U/L   Alkaline Phosphatase 196 (H) 38 - 126 U/L   Total Bilirubin 2.1 (H) 0.3 - 1.2 mg/dL   GFR calc non Af Amer >60 >60 mL/min   GFR calc Af Amer >60 >60 mL/min    Comment: (NOTE) The eGFR has been calculated using the CKD EPI equation. This calculation has not been validated in all clinical situations. eGFR's persistently <60 mL/min signify possible Chronic Kidney Disease.    Anion gap 11 5 - 15    Comment: Performed at [facility] [address] [place] [address]  CBC     Status: Abnormal   Collection Time: 10/26/17  2:53 AM  Result Value Ref Range   WBC 13.8 (H) 4.0 - 10.5 K/uL   RBC 4.93 4.22 - 5.81 MIL/uL   Hemoglobin 15.5 13.0 - 17.0 g/dL    HCT 44.6 39.0 - 52.0 %   MCV 90.5 78.0 - 100.0 fL   MCH 31.4 26.0 - 34.0 pg   MCHC 34.8 30.0 - 36.0 g/dL   RDW 13.1 11.5 - 15.5 %   Platelets 286 150 - 400 K/uL    Comment: Performed at [facility], [place] [address] [place], [place] [address]  CBC     Status: Abnormal  Collection Time: 10/27/17  7:42 AM  Result Value Ref Range   WBC 13.7 (H) 4.0 - 10.5 K/uL   RBC 4.42 4.22 - 5.81 MIL/uL   Hemoglobin 13.8 13.0 - 17.0 g/dL   HCT 40.4 39.0 - 52.0 %   MCV 91.4 78.0 - 100.0 fL   MCH 31.2 26.0 - 34.0 pg   MCHC 34.2 30.0 - 36.0 g/dL   RDW 13.5 11.5 - 15.5 %   Platelets 266 150 - 400 K/uL    Comment: Performed at [facility], [place] [address] [place], [place] [address]  Comprehensive metabolic panel     Status: Abnormal   Collection Time: 10/27/17  7:42 AM  Result Value Ref Range   Sodium 136 135 - 145 mmol/L   Potassium 3.4 (L) 3.5 - 5.1 mmol/L   Chloride 104 101 - 111 mmol/L   CO2 23 22 - 32 mmol/L   Glucose, Bld 113 (H) 65 - 99 mg/dL   BUN 5 (L) 6 - 20 mg/dL   Creatinine, Ser 0.76 0.61 - 1.24 mg/dL   Calcium 8.7 (L) 8.9 - 10.3 mg/dL   Total Protein 6.2 (L) 6.5 - 8.1 g/dL   Albumin 3.3 (L) 3.5 - 5.0 g/dL   AST 95 (H) 15 - 41 U/L   ALT 198 (H) 17 - 63 U/L   Alkaline Phosphatase 222 (H) 38 - 126 U/L   Total Bilirubin 3.6 (H) 0.3 - 1.2 mg/dL   GFR calc non Af Amer >60 >60 mL/min   GFR calc Af Amer >60 >60 mL/min    Comment: (NOTE) The eGFR has been calculated using the CKD EPI equation. This calculation has not been validated in all clinical situations. eGFR's persistently <60 mL/min signify possible Chronic Kidney Disease.    Anion gap 9 5 - 15    Comment: Performed at [facility] [address] [place], [place] [address]   US Abdomen Complete  Result Date: 10/27/2017 CLINICAL DATA:  Right upper quadrant pain. EXAM: ABDOMEN ULTRASOUND COMPLETE COMPARISON:  CT abdomen pelvis 10/25/2017. FINDINGS: Gallbladder: Numerous shadowing echogenic stones in the  gallbladder, measuring up to 1.5 cm. Associated wall thickening, 7 mm. Positive sonographic Murphy sign. No pericholecystic fluid. Common bile duct: Diameter: 7 mm, dilated for age. A 7 mm echogenic lesion is seen within the common bile duct. Liver: No focal lesion identified. Within normal limits in parenchymal echogenicity. Portal vein is patent on color Doppler imaging with normal direction of blood flow towards the liver. IVC: No abnormality visualized. Pancreas: Partially obscured by bowel gas. Spleen: Size and appearance within normal limits. Right Kidney: Length: 13.2 cm. Echogenicity within normal limits. No mass or hydronephrosis visualized. Left Kidney: Length: 14.2 cm. Echogenicity within normal limits. No mass or hydronephrosis visualized. Abdominal aorta: No aneurysm visualized. Other findings: None. IMPRESSION: 1. Cholelithiasis with wall thickening and positive sonographic Murphy sign, findings indicative of acute cholecystitis. Associated choledocholithiasis. These results will be called to the ordering clinician or representative by the Radiologist Assistant, and communication documented in the PACS or zVision Dashboard. Electronically Signed   By: Lorin Picket M.D.   On: 10/27/2017 08:24   Ct Abdomen Pelvis W Contrast  Result Date: 10/25/2017 CLINICAL DATA:  Acute onset of umbilical abdominal pain and nausea. EXAM: CT ABDOMEN AND PELVIS WITH CONTRAST TECHNIQUE: Multidetector CT imaging of the abdomen and pelvis was performed using the standard protocol following bolus administration of intravenous contrast. CONTRAST:  100 mL ISOVUE-300 IOPAMIDOL (ISOVUE-300) INJECTION 61%  COMPARISON:  None. FINDINGS: Lower chest: The visualized lung bases are grossly clear. The visualized portions of the mediastinum are unremarkable. There is focal wall thickening and minimal calcification at the distal esophagus, with a few adjacent nodes measuring up to 7 mm in short axis. A 9 mm node is noted at the  gastroesophageal junction. Malignancy is a concern. Hepatobiliary: The liver is unremarkable in appearance. The gallbladder is unremarkable in appearance. The common bile duct remains normal in caliber. Pancreas: The pancreas is within normal limits. Spleen: The spleen is unremarkable in appearance. Adrenals/Urinary Tract: The adrenal glands are unremarkable in appearance. The kidneys are within normal limits. There is no evidence of hydronephrosis. No renal or ureteral stones are identified. No perinephric stranding is seen. Stomach/Bowel: The stomach is unremarkable in appearance. The small bowel is within normal limits. The patient is status post appendectomy. The colon is unremarkable in appearance. Vascular/Lymphatic: Mild scattered calcification is seen along the distal abdominal aorta and its branches. A retroaortic left renal vein is noted. The inferior vena cava is grossly unremarkable. No retroperitoneal lymphadenopathy is seen. No pelvic sidewall lymphadenopathy is identified. Reproductive: The bladder is mildly distended and grossly unremarkable. The prostate is normal in size. Other: No additional soft tissue abnormalities are seen. Musculoskeletal: No acute osseous abnormalities are identified. The visualized musculature is unremarkable in appearance. IMPRESSION: 1. No acute abnormality seen to explain the patient's symptoms. 2. Focal wall thickening and minimal calcification at the distal esophagus, with a few adjacent nodes measuring up to 7 mm in short axis. 9 mm node noted at the gastroesophageal junction. This raises concern for malignancy. Endoscopy is recommended for further evaluation, when and as deemed clinically appropriate. Aortic Atherosclerosis (ICD10-I70.0). These results were called by telephone at the time of interpretation on 10/25/2017 at 10:17 pm to Dr. Pattricia Boss, who verbally acknowledged these results. Electronically Signed   By: Garald Balding M.D.   On: 10/25/2017 22:19       Assessment/Plan  Active Problems:   Epigastric pain   Abnormal LFTs   Hypokalemia   Acute epigastric pain   RUQ pain   Abnormal CT scan, esophagus   Reflux esophagitis  Cholecystitis  - OR tomorrow after ERCP  - okay for clears and PO pain control from our standpoint then NPO at midnight - IV Zosyn - GI tentatively planning ERCP tomorrow  Thank you for the consult.   Kalman Drape, [place] 10/27/2017, 10:28 AM Pager: [phone] Consults: [phone] Mon-Fri 7:00 am-4:30 pm Sat-Sun 7:00 am-11:30 am

## 2017-10-27 NOTE — Progress Notes (Signed)
Pharmacy Antibiotic Note  Bryan CookeyJonathan Flynn is a 47 y.o. male admitted on 10/25/2017 with intra-abdominal infection.  Pharmacy has been consulted for Zosyn dosing.  Afebrile, WBC 13.7. US found cholelithiasis.  Plan: Start Zosyn 3.375 gm IV q8h (4 hour infusion) Monitor clinical picture, renal function F/U C&S, abx deescalation / LOT   Height: 5\' 11"  (180.3 cm) Weight: 179 lb (81.2 kg) IBW/kg (Calculated) : 75.3  Temp (24hrs), Avg:98.2 F (36.8 C), Min:98.1 F (36.7 C), Max:98.4 F (36.9 C)  Recent Labs  Lab 10/25/17 2008 10/26/17 0253 10/27/17 0742  WBC 13.0* 13.8* 13.7*  CREATININE 1.03 0.93 0.76    Estimated Creatinine Clearance: 122.9 mL/min (by C-G formula based on SCr of 0.76 mg/dL).    No Known Allergies  Thank you for allowing pharmacy to be a part of this patient's care.  Bryan Flynn,Bryan Flynn 10/27/2017 9:11 AM

## 2017-10-27 NOTE — Progress Notes (Signed)
PROGRESS NOTE    Bryan Flynn  ZOX:[id number] DOB: [birth year] DOA: 10/25/2017 PCP: Patient, No Pcp Per   Outpatient Specialists:     Brief Narrative:  47 y.o. male with medical history significant of prior esophageal surgery   Admitted for significant epigastric pain with possible esophageal mass and elevated LFTs and was found to have acute cholecystitis.     Assessment & Plan:   Active Problems:   Epigastric pain   Abnormal LFTs   Hypokalemia   Acute epigastric pain   RUQ pain   Abnormal CT scan, esophagus   Reflux esophagitis    Acute epigastric pain with RUQ pain due to acute cholecystitis -ERCP in AM to be followed by surgery -PO and IV pain control -IV zosyn  Elevated LFTs -  -hepatitis panel pending  Hypokalemia -replace via fluid  Dehydration -IVF     DVT prophylaxis:  SCD's  Code Status: Full Code   Family Communication:   Disposition Plan:     Consultants:  GI General Surgery  Subjective: Much more comfortable appearing now  Objective: Vitals:   10/26/17 2237 10/27/17 0225 10/27/17 0615 10/27/17 1024  BP: 122/78 123/70 126/73 122/77  Pulse: 82 70 80 71  Resp: 18 18 18 18   Temp: 98.4 F (36.9 C) 98.3 F (36.8 C) 98.1 F (36.7 C) 97.9 F (36.6 C)  TempSrc: Oral   Oral  SpO2: 98% 96% 95% 97%  Weight:      Height:        Intake/Output Summary (Last 24 hours) at 10/27/2017 1254 Last data filed at 10/27/2017 0900 Gross per 24 hour  Intake 1452 ml  Output 700 ml  Net 752 ml   Filed Weights   10/26/17 0343 10/26/17 1427  Weight: 81.4 kg (179 lb 7.3 oz) 81.2 kg (179 lb)    Examination:  General exam: Appears calm and comfortable  Respiratory system: Clear to auscultation. Respiratory effort normal. Cardiovascular system: S1 & S2 heard, RRR. No JVD, murmurs, rubs, gallops or clicks. No pedal edema. Gastrointestinal system: Abdomen is nondistended, soft and nontender. No organomegaly or masses felt. Normal bowel  sounds heard. Central nervous system: Alert and oriented. No focal neurological deficits. Extremities: Symmetric 5 x 5 power. Skin: No rashes, lesions or ulcers Psychiatry: Judgement and insight appear normal. Mood & affect appropriate.     Data Reviewed: I have personally reviewed following labs and imaging studies  CBC: Recent Labs  Lab 10/25/17 2008 10/26/17 0253 10/27/17 0742  WBC 13.0* 13.8* 13.7*  HGB 16.2 15.5 13.8  HCT 45.8 44.6 40.4  MCV 90.9 90.5 91.4  PLT 276 286 266   Basic Metabolic Panel: Recent Labs  Lab 10/25/17 2008 10/26/17 0253 10/27/17 0742  NA 137 136 136  K 3.3* 3.7 3.4*  CL 100* 103 104  CO2 24 22 23   GLUCOSE 106* 120* 113*  BUN 6 <5* 5*  CREATININE 1.03 0.93 0.76  CALCIUM 9.3 8.9 8.7*  MG 2.1 2.0  --   PHOS 3.5 2.9  --    GFR: Estimated Creatinine Clearance: 122.9 mL/min (by C-G formula based on SCr of 0.76 mg/dL). Liver Function Tests: Recent Labs  Lab 10/25/17 2008 10/26/17 0253 10/27/17 0742  AST 86* 128* 95*  ALT 242* 249* 198*  ALKPHOS 162* 196* 222*  BILITOT 1.2 2.1* 3.6*  PROT 7.5 7.0 6.2*  ALBUMIN 3.9 3.8 3.3*   Recent Labs  Lab 10/25/17 2008  LIPASE 40   No results for input(s): AMMONIA in the last  168 hours. Coagulation Profile: No results for input(s): INR, PROTIME in the last 168 hours. Cardiac Enzymes: Recent Labs  Lab 10/25/17 2008  CKTOTAL 54   BNP (last 3 results) No results for input(s): PROBNP in the last 8760 hours. HbA1C: No results for input(s): HGBA1C in the last 72 hours. CBG: No results for input(s): GLUCAP in the last 168 hours. Lipid Profile: No results for input(s): CHOL, HDL, LDLCALC, TRIG, CHOLHDL, LDLDIRECT in the last 72 hours. Thyroid Function Tests: Recent Labs    10/26/17 0253  TSH 4.044   Anemia Panel: No results for input(s): VITAMINB12, FOLATE, FERRITIN, TIBC, IRON, RETICCTPCT in the last 72 hours. Urine analysis:    Component Value Date/Time   COLORURINE YELLOW  10/25/2017 2008   APPEARANCEUR CLEAR 10/25/2017 2008   LABSPEC 1.015 10/25/2017 2008   PHURINE 6.0 10/25/2017 2008   GLUCOSEU NEGATIVE 10/25/2017 2008   HGBUR SMALL (A) 10/25/2017 2008   BILIRUBINUR NEGATIVE 10/25/2017 2008   KETONESUR NEGATIVE 10/25/2017 2008   PROTEINUR NEGATIVE 10/25/2017 2008   NITRITE NEGATIVE 10/25/2017 2008   LEUKOCYTESUR TRACE (A) 10/25/2017 2008    )No results found for this or any previous visit (from the past 240 hour(s)).    Anti-infectives (From admission, onward)   Start     Dose/Rate Route Frequency Ordered Stop   10/27/17 1200  piperacillin-tazobactam (ZOSYN) IVPB 3.375 g  Status:  Discontinued     3.375 g 12.5 mL/hr over 240 Minutes Intravenous Every 6 hours 10/27/17 0904 10/27/17 0907   10/27/17 1000  piperacillin-tazobactam (ZOSYN) IVPB 3.375 g  Status:  Discontinued     3.375 g 12.5 mL/hr over 240 Minutes Intravenous Every 8 hours 10/27/17 0909 10/27/17 0940       Radiology Studies: [place] Abdomen Complete  Result Date: 10/27/2017 CLINICAL DATA:  Right upper quadrant pain. EXAM: ABDOMEN ULTRASOUND COMPLETE COMPARISON:  CT abdomen pelvis 10/25/2017. FINDINGS: Gallbladder: Numerous shadowing echogenic stones in the gallbladder, measuring up to 1.5 cm. Associated wall thickening, 7 mm. Positive sonographic Murphy sign. No pericholecystic fluid. Common bile duct: Diameter: 7 mm, dilated for age. A 7 mm echogenic lesion is seen within the common bile duct. Liver: No focal lesion identified. Within normal limits in parenchymal echogenicity. Portal vein is patent on color Doppler imaging with normal direction of blood flow towards the liver. IVC: No abnormality visualized. Pancreas: Partially obscured by bowel gas. Spleen: Size and appearance within normal limits. Right Kidney: Length: 13.2 cm. Echogenicity within normal limits. No mass or hydronephrosis visualized. Left Kidney: Length: 14.2 cm. Echogenicity within normal limits. No mass or hydronephrosis  visualized. Abdominal aorta: No aneurysm visualized. Other findings: None. IMPRESSION: 1. Cholelithiasis with wall thickening and positive sonographic Murphy sign, findings indicative of acute cholecystitis. Associated choledocholithiasis. These results will be called to the ordering clinician or representative by the Radiologist Assistant, and communication documented in the PACS or zVision Dashboard. Electronically Signed   By: Leanna BattlesMelinda  Blietz M.D.   On: 10/27/2017 08:24   Ct Abdomen Pelvis W Contrast  Result Date: 10/25/2017 CLINICAL DATA:  Acute onset of umbilical abdominal pain and nausea. EXAM: CT ABDOMEN AND PELVIS WITH CONTRAST TECHNIQUE: Multidetector CT imaging of the abdomen and pelvis was performed using the standard protocol following bolus administration of intravenous contrast. CONTRAST:  100 mL ISOVUE-300 IOPAMIDOL (ISOVUE-300) INJECTION 61% COMPARISON:  None. FINDINGS: Lower chest: The visualized lung bases are grossly clear. The visualized portions of the mediastinum are unremarkable. There is focal wall thickening and minimal calcification at the distal  esophagus, with a few adjacent nodes measuring up to 7 mm in short axis. A 9 mm node is noted at the gastroesophageal junction. Malignancy is a concern. Hepatobiliary: The liver is unremarkable in appearance. The gallbladder is unremarkable in appearance. The common bile duct remains normal in caliber. Pancreas: The pancreas is within normal limits. Spleen: The spleen is unremarkable in appearance. Adrenals/Urinary Tract: The adrenal glands are unremarkable in appearance. The kidneys are within normal limits. There is no evidence of hydronephrosis. No renal or ureteral stones are identified. No perinephric stranding is seen. Stomach/Bowel: The stomach is unremarkable in appearance. The small bowel is within normal limits. The patient is status post appendectomy. The colon is unremarkable in appearance. Vascular/Lymphatic: Mild scattered  calcification is seen along the distal abdominal aorta and its branches. A retroaortic left renal vein is noted. The inferior vena cava is grossly unremarkable. No retroperitoneal lymphadenopathy is seen. No pelvic sidewall lymphadenopathy is identified. Reproductive: The bladder is mildly distended and grossly unremarkable. The prostate is normal in size. Other: No additional soft tissue abnormalities are seen. Musculoskeletal: No acute osseous abnormalities are identified. The visualized musculature is unremarkable in appearance. IMPRESSION: 1. No acute abnormality seen to explain the patient's symptoms. 2. Focal wall thickening and minimal calcification at the distal esophagus, with a few adjacent nodes measuring up to 7 mm in short axis. 9 mm node noted at the gastroesophageal junction. This raises concern for malignancy. Endoscopy is recommended for further evaluation, when and as deemed clinically appropriate. Aortic Atherosclerosis (ICD10-I70.0). These results were called by telephone at the time of interpretation on 10/25/2017 at 10:17 pm to Dr. Margarita Grizzle, who verbally acknowledged these results. Electronically Signed   By: Roanna Raider M.D.   On: 10/25/2017 22:19        Scheduled Meds: . pantoprazole (PROTONIX) IV  40 mg Intravenous Q12H   Continuous Infusions: . 0.9 % NaCl with KCl 20 mEq / L 100 mL/hr at 10/27/17 0856     LOS: 1 day    Time spent: 35 min    Joseph Art, DO [facility] Pager [phone]  If 7PM-7AM, please contact night-coverage [email or web address] Password [facility] 10/27/2017, 12:54 PM

## 2017-10-27 NOTE — Progress Notes (Addendum)
    US indicates acute cholecystitis  I will start Abx  Will need [facility] consult and will need an ERCP tentatively tomorrow at 1030  My PA student has seen patient and I will follow-up.  Keep NPO  Carl E. Gessner, MD, FACG [facility] [phone] (pager) 10/27/2017 9:01 AM  

## 2017-10-27 NOTE — Progress Notes (Addendum)
   Patient Name: Bryan CookeyJonathan Flynn Date of Encounter: 10/27/2017, 8:44 AM    Subjective  Pt appears to be doing well, dilaudid helping with his pain. He reports no fevers, chills, CP, SOB, N/V/D. Wife at bedside.    Objective  BP 126/73   Pulse 80   Temp 98.1 F (36.7 C)   Resp 18   Ht 5\' 11"  (1.803 m)   Wt 81.2 kg (179 lb)   SpO2 95%   BMI 24.97 kg/m   General:  Slightly uncomfortable, NAD Eyes:   anicteric Lungs:  CTAB Heart::  S1S2 no rubs, murmurs or gallops Abdomen:  soft and mildly tender with deep palpation, BS+ Ext:   no edema, cyanosis or clubbing, 2+ pulses   2/13 US abdomen showed cholelithiasis w/wall thickening, associated choledocholithiasis, and positive sonographic murphy sign.   2/12 EGD negative for mass, evidence of reflux esophagitis seen, bx pending.  Lab Results  Component Value Date   ALT 198 (H) 10/27/2017   AST 95 (H) 10/27/2017   ALKPHOS 222 (H) 10/27/2017   BILITOT 3.6 (H) 10/27/2017   Lab Results  Component Value Date   LIPASE 40 10/25/2017     Assessment and Plan   1. Acute cholecystitis with evidence of choledocholithiasis- continue NPO, IV Zosyn and surgery consult placed by Hospitalist. ERCP ordered to be done tomorrow.   Hospitalist and surgery consult appreciated.   2. GERD with esophagitis - stay on PPI   [facility] Attending   I have personally seen the patient, reviewed and repeated key elements of the history and physical and participated in formation of the assessment and plan the student has documented.  Plans as above  The risks and benefits as well as alternatives of endoscopic procedure(s) have been discussed and reviewed. All questions answered. The patient agrees to proceed. Discussed w/ Dr. Magnus IvanBlackman of [facility]

## 2017-10-27 NOTE — H&P (View-Only) (Signed)
    US indicates acute cholecystitis  I will start Abx  Will need [facility] consult and will need an ERCP tentatively tomorrow at 1030  My PA student has seen patient and I will follow-up.  Keep NPO  Iva Booparl E. Everli Rother, MD, Antionette FairyFACG [facility] [phone] (pager) 10/27/2017 9:01 AM

## 2017-10-28 ENCOUNTER — Inpatient Hospital Stay (HOSPITAL_COMMUNITY): Payer: Commercial Managed Care - PPO

## 2017-10-28 ENCOUNTER — Inpatient Hospital Stay (HOSPITAL_COMMUNITY): Payer: Commercial Managed Care - PPO | Admitting: Certified Registered Nurse Anesthetist

## 2017-10-28 ENCOUNTER — Encounter (HOSPITAL_COMMUNITY): Payer: Self-pay | Admitting: Certified Registered Nurse Anesthetist

## 2017-10-28 ENCOUNTER — Encounter (HOSPITAL_COMMUNITY)
Admission: EM | Disposition: A | Discharge status: Home / Self Care | Payer: Self-pay | Source: Home / Self Care | Attending: Internal Medicine

## 2017-10-28 DIAGNOSIS — K8050 Calculus of bile duct without cholangitis or cholecystitis without obstruction: Secondary | ICD-10-CM

## 2017-10-28 HISTORY — PX: ERCP: SHX5425

## 2017-10-28 SURGERY — ERCP, WITH INTERVENTION IF INDICATED
Anesthesia: General

## 2017-10-28 MED ORDER — ONDANSETRON HCL 4 MG/2ML IJ SOLN
INTRAMUSCULAR | Status: DC | PRN
  Administered 2017-10-28: 4 mg via INTRAVENOUS

## 2017-10-28 MED ORDER — LACTATED RINGERS IV SOLN: INTRAVENOUS | Status: DC

## 2017-10-28 MED ORDER — SODIUM CHLORIDE 0.9 % IV SOLN
INTRAVENOUS | Status: DC | PRN
  Administered 2017-10-28: 10 mL

## 2017-10-28 MED ORDER — FENTANYL CITRATE (PF) 250 MCG/5ML IJ SOLN
INTRAMUSCULAR | Status: DC | PRN
  Administered 2017-10-28: 100 ug via INTRAVENOUS

## 2017-10-28 MED ORDER — PROPOFOL 10 MG/ML IV BOLUS
INTRAVENOUS | Status: DC | PRN
  Administered 2017-10-28: 150 mg via INTRAVENOUS
  Administered 2017-10-28: 50 mg via INTRAVENOUS

## 2017-10-28 MED ORDER — INDOMETHACIN 50 MG RE SUPP: 100.0000 mg | Freq: Once | RECTAL | Status: DC

## 2017-10-28 MED ORDER — IOPAMIDOL (ISOVUE-300) INJECTION 61%
INTRAVENOUS | Status: AC
  Filled 2017-10-28: qty 50

## 2017-10-28 MED ORDER — LIDOCAINE 2% (20 MG/ML) 5 ML SYRINGE
INTRAMUSCULAR | Status: DC | PRN
  Administered 2017-10-28: 100 mg via INTRAVENOUS

## 2017-10-28 MED ORDER — INDOMETHACIN 50 MG RE SUPP
RECTAL | Status: DC | PRN
  Administered 2017-10-28 (×2): 50 mg via RECTAL

## 2017-10-28 MED ORDER — GLUCAGON HCL RDNA (DIAGNOSTIC) 1 MG IJ SOLR
INTRAMUSCULAR | Status: AC
  Filled 2017-10-28: qty 1

## 2017-10-28 MED ORDER — SUCCINYLCHOLINE CHLORIDE 20 MG/ML IJ SOLN
INTRAMUSCULAR | Status: DC | PRN
  Administered 2017-10-28: 120 mg via INTRAVENOUS

## 2017-10-28 MED ORDER — MIDAZOLAM HCL 2 MG/2ML IJ SOLN
INTRAMUSCULAR | Status: DC | PRN
  Administered 2017-10-28 (×2): 1 mg via INTRAVENOUS

## 2017-10-28 MED ORDER — LACTATED RINGERS IV SOLN
INTRAVENOUS | Status: AC | PRN
  Administered 2017-10-28: 1000 mL via INTRAVENOUS

## 2017-10-28 MED ORDER — INDOMETHACIN 50 MG RE SUPP
RECTAL | Status: AC
Start: 2017-10-28 — End: 2017-10-28
  Filled 2017-10-28: qty 2

## 2017-10-28 NOTE — Transfer of Care (Signed)
Immediate Anesthesia Transfer of Care Note  Patient: Bryan Flynn  Procedure(s) Performed: ENDOSCOPIC RETROGRADE CHOLANGIOPANCREATOGRAPHY (ERCP) (N/A )  Patient Location: Endoscopy Unit  Anesthesia Type:General  Level of Consciousness: drowsy and patient cooperative  Airway & Oxygen Therapy: Patient Spontanous Breathing  Post-op Assessment: Report given to RN, Post -op Vital signs reviewed and stable and Patient moving all extremities X 4  Post vital signs: Reviewed and stable  Last Vitals:  Vitals:   10/28/17 0944 10/28/17 1227  BP: (!) 141/80 135/78  Pulse:  (!) 101  Resp: (!) 21 (!) 21  Temp: 37.1 C 36.9 C  SpO2: 99% 97%    Last Pain:  Vitals:   10/28/17 1227  TempSrc: Oral  PainSc:          Complications: No apparent anesthesia complications

## 2017-10-28 NOTE — Progress Notes (Signed)
Patient at hospital No letter needed and no recall   Ulceration in esophagus - GERD On Tx

## 2017-10-28 NOTE — Progress Notes (Addendum)
PROGRESS NOTE    Bryan Flynn  ZOX:[id number] DOB: 12-24-1970 DOA: 10/25/2017 PCP: Patient, No Pcp Per   Outpatient Specialists:     Brief Narrative:  47 y.o. male with medical history significant of prior esophageal surgery   Admitted for significant epigastric pain with possible esophageal mass and elevated LFTs and was found to have acute cholecystitis after EGD and biopsies done for possible esophageal mass on CT.  ERCP 2/14 and lap chole on 2/15     Assessment & Plan:   Active Problems:   Epigastric pain   Abnormal LFTs   Hypokalemia   Acute epigastric pain   RUQ pain   Abnormal CT scan, esophagus   Reflux esophagitis   Choledocholithiasis with acute cholecystitis    Acute epigastric pain with RUQ pain due to acute cholecystitis -s/p EGD and biopsies for esophageal mass on CT Scan-- reflux and hiatal hernia found -ERCP 2/14 to be followed by surgery on 2/15 -PO and IV pain control -IV zosyn  Elevated LFTs -  -hepatitis panel pending  Hypokalemia -replace via fluid  Dehydration -IVF     DVT prophylaxis:  SCD's  Code Status: Full Code   Family Communication: At bedside  Disposition Plan:  Home after lap chole   Consultants:  GI General Surgery  Subjective: Dreaming about all the food he wants to eat when better-- eggs, bacon, steak-- some pain overnight  Objective: Vitals:   10/27/17 1342 10/27/17 2118 10/28/17 0212 10/28/17 0648  BP: 126/70 138/84 132/80 129/74  Pulse: 74 78 75 83  Resp: 18 19 18 18   Temp: 98 F (36.7 C) 97.8 F (36.6 C) 99 F (37.2 C) 98.7 F (37.1 C)  TempSrc: Oral Oral    SpO2: 98% 100% 98% 99%  Weight:      Height:        Intake/Output Summary (Last 24 hours) at 10/28/2017 0938 Last data filed at 10/28/2017 0651 Gross per 24 hour  Intake 2411.67 ml  Output 1750 ml  Net 661.67 ml   Filed Weights   10/26/17 0343 10/26/17 1427  Weight: 81.4 kg (179 lb 7.3 oz) 81.2 kg (179 lb)     Examination:  General exam: NAD Central nervous system: alert and oriented Skin: no LE edema Psychiatry: appropriate     Data Reviewed: I have personally reviewed following labs and imaging studies  CBC: Recent Labs  Lab 10/25/17 2008 10/26/17 0253 10/27/17 0742  WBC 13.0* 13.8* 13.7*  HGB 16.2 15.5 13.8  HCT 45.8 44.6 40.4  MCV 90.9 90.5 91.4  PLT 276 286 266   Basic Metabolic Panel: Recent Labs  Lab 10/25/17 2008 10/26/17 0253 10/27/17 0742  NA 137 136 136  K 3.3* 3.7 3.4*  CL 100* 103 104  CO2 24 22 23   GLUCOSE 106* 120* 113*  BUN 6 <5* 5*  CREATININE 1.03 0.93 0.76  CALCIUM 9.3 8.9 8.7*  MG 2.1 2.0  --   PHOS 3.5 2.9  --    GFR: Estimated Creatinine Clearance: 122.9 mL/min (by C-G formula based on SCr of 0.76 mg/dL). Liver Function Tests: Recent Labs  Lab 10/25/17 2008 10/26/17 0253 10/27/17 0742  AST 86* 128* 95*  ALT 242* 249* 198*  ALKPHOS 162* 196* 222*  BILITOT 1.2 2.1* 3.6*  PROT 7.5 7.0 6.2*  ALBUMIN 3.9 3.8 3.3*   Recent Labs  Lab 10/25/17 2008  LIPASE 40   No results for input(s): AMMONIA in the last 168 hours. Coagulation Profile: No results for input(s): INR,  PROTIME in the last 168 hours. Cardiac Enzymes: Recent Labs  Lab 10/25/17 2008  CKTOTAL 54   BNP (last 3 results) No results for input(s): PROBNP in the last 8760 hours. HbA1C: No results for input(s): HGBA1C in the last 72 hours. CBG: No results for input(s): GLUCAP in the last 168 hours. Lipid Profile: No results for input(s): CHOL, HDL, LDLCALC, TRIG, CHOLHDL, LDLDIRECT in the last 72 hours. Thyroid Function Tests: Recent Labs    10/26/17 0253  TSH 4.044   Anemia Panel: No results for input(s): VITAMINB12, FOLATE, FERRITIN, TIBC, IRON, RETICCTPCT in the last 72 hours. Urine analysis:    Component Value Date/Time   COLORURINE YELLOW 10/25/2017 2008   APPEARANCEUR CLEAR 10/25/2017 2008   LABSPEC 1.015 10/25/2017 2008   PHURINE 6.0 10/25/2017 2008    GLUCOSEU NEGATIVE 10/25/2017 2008   HGBUR SMALL (A) 10/25/2017 2008   BILIRUBINUR NEGATIVE 10/25/2017 2008   KETONESUR NEGATIVE 10/25/2017 2008   PROTEINUR NEGATIVE 10/25/2017 2008   NITRITE NEGATIVE 10/25/2017 2008   LEUKOCYTESUR TRACE (A) 10/25/2017 2008    )No results found for this or any previous visit (from the past 240 hour(s)).    Anti-infectives (From admission, onward)   Start     Dose/Rate Route Frequency Ordered Stop   10/27/17 1800  piperacillin-tazobactam (ZOSYN) IVPB 3.375 g  Status:  Discontinued     3.375 g 100 mL/hr over 30 Minutes Intravenous Every 8 hours 10/27/17 1700 10/27/17 1702   10/27/17 1800  [MAR Hold]  piperacillin-tazobactam (ZOSYN) IVPB 3.375 g     (MAR Hold since 10/28/17 0932)   3.375 g 12.5 mL/hr over 240 Minutes Intravenous Every 8 hours 10/27/17 1702     10/27/17 1200  piperacillin-tazobactam (ZOSYN) IVPB 3.375 g  Status:  Discontinued     3.375 g 12.5 mL/hr over 240 Minutes Intravenous Every 6 hours 10/27/17 0904 10/27/17 0907   10/27/17 1000  piperacillin-tazobactam (ZOSYN) IVPB 3.375 g  Status:  Discontinued     3.375 g 12.5 mL/hr over 240 Minutes Intravenous Every 8 hours 10/27/17 0909 10/27/17 0940       Radiology Studies: [place] Abdomen Complete  Result Date: 10/27/2017 CLINICAL DATA:  Right upper quadrant pain. EXAM: ABDOMEN ULTRASOUND COMPLETE COMPARISON:  CT abdomen pelvis 10/25/2017. FINDINGS: Gallbladder: Numerous shadowing echogenic stones in the gallbladder, measuring up to 1.5 cm. Associated wall thickening, 7 mm. Positive sonographic Murphy sign. No pericholecystic fluid. Common bile duct: Diameter: 7 mm, dilated for age. A 7 mm echogenic lesion is seen within the common bile duct. Liver: No focal lesion identified. Within normal limits in parenchymal echogenicity. Portal vein is patent on color Doppler imaging with normal direction of blood flow towards the liver. IVC: No abnormality visualized. Pancreas: Partially obscured by bowel  gas. Spleen: Size and appearance within normal limits. Right Kidney: Length: 13.2 cm. Echogenicity within normal limits. No mass or hydronephrosis visualized. Left Kidney: Length: 14.2 cm. Echogenicity within normal limits. No mass or hydronephrosis visualized. Abdominal aorta: No aneurysm visualized. Other findings: None. IMPRESSION: 1. Cholelithiasis with wall thickening and positive sonographic Murphy sign, findings indicative of acute cholecystitis. Associated choledocholithiasis. These results will be called to the ordering clinician or representative by the Radiologist Assistant, and communication documented in the PACS or zVision Dashboard. Electronically Signed   By: Leanna BattlesMelinda  Blietz M.D.   On: 10/27/2017 08:24        Scheduled Meds: . [MAR Hold] pantoprazole (PROTONIX) IV  40 mg Intravenous Q12H   Continuous Infusions: . 0.9 % NaCl  with KCl 20 mEq / L 100 mL/hr at 10/28/17 0135  . [MAR Hold] piperacillin-tazobactam (ZOSYN)  IV Stopped (10/28/17 0541)     LOS: 2 days    Time spent: 15 min    Joseph Art, DO [facility] Pager [phone]  If 7PM-7AM, please contact night-coverage [email or web address] Password [facility] 10/28/2017, 9:38 AM

## 2017-10-28 NOTE — Progress Notes (Signed)
Received from endo post ERCP. Patient alert  but confused , crying asking for some cheeseburger and fries. Re orient patient and will continue to monitor patient. VSS.

## 2017-10-28 NOTE — Anesthesia Preprocedure Evaluation (Signed)
Anesthesia Evaluation  Patient identified by MRN, date of birth, ID band Patient awake    Reviewed: Allergy & Precautions, NPO status , Patient's Chart, lab work & pertinent test results  Airway Mallampati: II  TM Distance: >3 FB Neck ROM: Full    Dental no notable dental hx.    Pulmonary neg pulmonary ROS, asthma , Current Smoker,    Pulmonary exam normal breath sounds clear to auscultation       Cardiovascular negative cardio ROS Normal cardiovascular exam Rhythm:Regular Rate:Normal     Neuro/Psych negative neurological ROS  negative psych ROS   GI/Hepatic negative GI ROS, Neg liver ROS, GERD  ,  Endo/Other  negative endocrine ROS  Renal/GU negative Renal ROS  negative genitourinary   Musculoskeletal negative musculoskeletal ROS (+)   Abdominal   Peds negative pediatric ROS (+)  Hematology negative hematology ROS (+)   Anesthesia Other Findings Acute cholecystitis  Reproductive/Obstetrics negative OB ROS                             Anesthesia Physical Anesthesia Plan  ASA: II  Anesthesia Plan: General   Post-op Pain Management:    Induction: Intravenous, Rapid sequence and Cricoid pressure planned  PONV Risk Score and Plan: 1 and Ondansetron  Airway Management Planned: Oral ETT  Additional Equipment:   Intra-op Plan:   Post-operative Plan: Extubation in OR  Informed Consent: I have reviewed the patients History and Physical, chart, labs and discussed the procedure including the risks, benefits and alternatives for the proposed anesthesia with the patient or authorized representative who has indicated his/her understanding and acceptance.   Dental advisory given  Plan Discussed with: CRNA  Anesthesia Plan Comments:         Anesthesia Quick Evaluation

## 2017-10-28 NOTE — Progress Notes (Signed)
   Patient Name: Bryan CookeyJonathan Flynn Date of Encounter: 10/28/2017, 8:45 AM    Subjective  Pt is slightly uncomfortable today from the abdominal pain. No N/V/D, CP, SOB, fevers, or chills. Wife, mom, and sister at bedside.   Objective  BP 129/74   Pulse 83   Temp 98.7 F (37.1 C)   Resp 18   Ht 5\' 11"  (1.803 m)   Wt 81.2 kg (179 lb)   SpO2 99%   BMI 24.97 kg/m   General:  NAD Eyes:   anicteric Lungs:  Diffuse wheezing bilat Heart::  S1S2 no rubs, murmurs or gallops Abdomen:  Soft, mild-mod tender, BS+ Ext:   no edema, cyanosis or clubbing  Lab Results  Component Value Date   ALT 198 (H) 10/27/2017   AST 95 (H) 10/27/2017   ALKPHOS 222 (H) 10/27/2017   BILITOT 3.6 (H) 10/27/2017   CBC, CMP, and lipase for today pending.   Assessment and Plan   1. Acute cholecystitis with choledocholithiasis- pt to have ERCP this AM. Lap chole scheduled for tomorrow- surgery consult appreciated. Continue IV Zosyn.   2. GERD with esophagitis- continue PPI

## 2017-10-28 NOTE — Interval H&P Note (Signed)
History and Physical Interval Note:  10/28/2017 11:06 AM  Bryan Flynn  has presented today for surgery, with the diagnosis of Choledocholithiasis  The various methods of treatment have been discussed with the patient and family. After consideration of risks, benefits and other options for treatment, the patient has consented to  Procedure(s): ENDOSCOPIC RETROGRADE CHOLANGIOPANCREATOGRAPHY (ERCP) (N/A) as a surgical intervention .  The patient's history has been reviewed, patient examined, no change in status, stable for surgery.  I have reviewed the patient's chart and labs.  Questions were answered to the patient's satisfaction.     Stan Headarl Haila Dena

## 2017-10-28 NOTE — Anesthesia Postprocedure Evaluation (Signed)
Anesthesia Post Note  Patient: Bryan CookeyJonathan Flynn  Procedure(s) Performed: ENDOSCOPIC RETROGRADE CHOLANGIOPANCREATOGRAPHY (ERCP) (N/A )     Patient location during evaluation: PACU Anesthesia Type: General Level of consciousness: awake and alert Pain management: pain level controlled Vital Signs Assessment: post-procedure vital signs reviewed and stable Respiratory status: spontaneous breathing, nonlabored ventilation and respiratory function stable Cardiovascular status: blood pressure returned to baseline and stable Postop Assessment: no apparent nausea or vomiting Anesthetic complications: no    Last Vitals:  Vitals:   10/28/17 0944 10/28/17 1227  BP: (!) 141/80 135/78  Pulse:  (!) 101  Resp: (!) 21 (!) 21  Temp: 37.1 C 36.9 C  SpO2: 99% 97%    Last Pain:  Vitals:   10/28/17 1227  TempSrc: Oral  PainSc:                  Lowella CurbWarren Ray Jacqualyn Sedgwick

## 2017-10-28 NOTE — Anesthesia Procedure Notes (Signed)
Procedure Name: Intubation Date/Time: 10/28/2017 11:23 AM Performed by: Julieta Bellini, CRNA Pre-anesthesia Checklist: Emergency Drugs available, Suction available, Patient being monitored and Patient identified Patient Re-evaluated:Patient Re-evaluated prior to induction Oxygen Delivery Method: Circle system utilized Preoxygenation: Pre-oxygenation with 100% oxygen Induction Type: IV induction, Rapid sequence and Cricoid Pressure applied Laryngoscope Size: Mac and 4 Grade View: Grade I Tube type: Oral Tube size: 7.5 mm Number of attempts: 1 Airway Equipment and Method: Stylet Placement Confirmation: ETT inserted through vocal cords under direct vision,  positive ETCO2 and breath sounds checked- equal and bilateral Secured at: 23 cm Tube secured with: Tape Dental Injury: Teeth and Oropharynx as per pre-operative assessment

## 2017-10-28 NOTE — Op Note (Signed)
[facility] Patient Name: Bryan Flynn Procedure Date : 10/28/2017 MRN: [id number] Attending MD: Iva Boop , MD Date of Birth: December 24, 1970 CSN: [id number] Age: 47 Admit Type: Inpatient Procedure:                ERCP Indications:              Bile duct stone(s), For therapy of bile duct                            stone(s) Providers:                Iva Boop, MD, Norman Clay, RN, Kandice Robinsons, Technician Referring MD:              Medicines:                General Anesthesia, Running Zosyn Complications:            No immediate complications. Estimated Blood Loss:     Estimated blood loss: none. Procedure:                Pre-Anesthesia Assessment:                           - Prior to the procedure, a History and Physical                            was performed, and patient medications and                            allergies were reviewed. The patient's tolerance of                            previous anesthesia was also reviewed. The risks                            and benefits of the procedure and the sedation                            options and risks were discussed with the patient.                            All questions were answered, and informed consent                            was obtained. Prior Anticoagulants: The patient has                            taken no previous anticoagulant or antiplatelet                            agents. ASA Grade Assessment: II - A patient with  mild systemic disease. After reviewing the risks                            and benefits, the patient was deemed in                            satisfactory condition to undergo the procedure.                           After obtaining informed consent, the scope was                            passed under direct vision. Throughout the                            procedure, the patient's blood pressure, pulse, and                        oxygen saturations were monitored continuously. The                            [id number] scope was introduced through the                            mouth, and used to inject contrast into and used to                            inject contrast into the bile duct. The ERCP was                            accomplished without difficulty. The patient                            tolerated the procedure well. Scope In: Scope Out: Findings:      The scout film was normal. Esophagus not seen well.      Stomach, duodenum and papilla normal      Papilla was mobile but cannulated CBD deeply with wire - pancreas not       entered. Contrast injected - small mobile stone in distal CBD, mildly       dilated extrahepatics. Gallbladder did not fill. Biliary sphincterotomy       over wire and balloon extraction of stone and negative occlusion       cholabgiogram after. Impression:               - Choledocholithiasis was found. Complete removal                            was accomplished by biliary sphincterotomy and                            balloon extraction. Recommendation:           - Return patient to hospital ward for ongoing care.                           -  Surgical consultation for consideration of                            cholecystectomy tomorrow.                           - Clear liquid diet today. Procedure Code(s):        --- Professional ---                           [phone], Endoscopic retrograde                            cholangiopancreatography (ERCP); diagnostic,                            including collection of specimen(s) by brushing or                            washing, when performed (separate procedure) Diagnosis Code(s):        --- Professional ---                           K80.50, Calculus of bile duct without cholangitis                            or cholecystitis without obstruction CPT copyright 2016 [employer] Association. All rights  reserved. The codes documented in this report are preliminary and upon coder review may  be revised to meet current compliance requirements. Iva Boop, MD 10/28/2017 12:29:43 PM This report has been signed electronically. Number of Addenda: 0

## 2017-10-28 NOTE — Progress Notes (Signed)
[facility] Progress Note  2 Days Post-Op  Subjective: CC-  Patient states that he continues to have abdominal pain, mostly in the epigastric region. Denies n/v. Going for ERCP today. VSS.  Objective: Vital signs in last 24 hours: Temp:  [97.8 F (36.6 C)-99 F (37.2 C)] 98.7 F (37.1 C) (02/14 0648) Pulse Rate:  [71-83] 83 (02/14 0648) Resp:  [18-19] 18 (02/14 0648) BP: (122-138)/(70-84) 129/74 (02/14 0648) SpO2:  [97 %-100 %] 99 % (02/14 0648) Last BM Date: 10/21/17  Intake/Output from previous day: 02/13 0701 - 02/14 0700 In: 2411.7 [P.O.:120; I.V.:2191.7; IV Piggyback:100] Out: 1750 [Urine:1750] Intake/Output this shift: No intake/output data recorded.  PE: Gen:  Alert, NAD HEENT: EOM's intact, pupils equal and round Card:  RRR, no M/G/R heard Pulm:  CTAB, no W/R/R, effort normal Abd: Soft, ND, moderate TTP epigastric region and RUQ without rebound, +BS, no HSM, no hernia Ext:  No erythema, edema, or tenderness BUE/BLE  Psych: A&Ox3  Skin: no rashes noted, warm and dry  Lab Results:  Recent Labs    10/26/17 0253 10/27/17 0742  WBC 13.8* 13.7*  HGB 15.5 13.8  HCT 44.6 40.4  PLT 286 266   BMET Recent Labs    10/26/17 0253 10/27/17 0742  NA 136 136  K 3.7 3.4*  CL 103 104  CO2 22 23  GLUCOSE 120* 113*  BUN <5* 5*  CREATININE 0.93 0.76  CALCIUM 8.9 8.7*   PT/INR No results for input(s): LABPROT, INR in the last 72 hours. CMP     Component Value Date/Time   NA 136 10/27/2017 0742   K 3.4 (L) 10/27/2017 0742   CL 104 10/27/2017 0742   CO2 23 10/27/2017 0742   GLUCOSE 113 (H) 10/27/2017 0742   BUN 5 (L) 10/27/2017 0742   CREATININE 0.76 10/27/2017 0742   CALCIUM 8.7 (L) 10/27/2017 0742   PROT 6.2 (L) 10/27/2017 0742   ALBUMIN 3.3 (L) 10/27/2017 0742   AST 95 (H) 10/27/2017 0742   ALT 198 (H) 10/27/2017 0742   ALKPHOS 222 (H) 10/27/2017 0742   BILITOT 3.6 (H) 10/27/2017 0742   GFRNONAA >60 10/27/2017 0742   GFRAA >60  10/27/2017 0742   Lipase     Component Value Date/Time   LIPASE 40 10/25/2017 2008       Studies/Results: [place] Abdomen Complete  Result Date: 10/27/2017 CLINICAL DATA:  Right upper quadrant pain. EXAM: ABDOMEN ULTRASOUND COMPLETE COMPARISON:  CT abdomen pelvis 10/25/2017. FINDINGS: Gallbladder: Numerous shadowing echogenic stones in the gallbladder, measuring up to 1.5 cm. Associated wall thickening, 7 mm. Positive sonographic Murphy sign. No pericholecystic fluid. Common bile duct: Diameter: 7 mm, dilated for age. A 7 mm echogenic lesion is seen within the common bile duct. Liver: No focal lesion identified. Within normal limits in parenchymal echogenicity. Portal vein is patent on color Doppler imaging with normal direction of blood flow towards the liver. IVC: No abnormality visualized. Pancreas: Partially obscured by bowel gas. Spleen: Size and appearance within normal limits. Right Kidney: Length: 13.2 cm. Echogenicity within normal limits. No mass or hydronephrosis visualized. Left Kidney: Length: 14.2 cm. Echogenicity within normal limits. No mass or hydronephrosis visualized. Abdominal aorta: No aneurysm visualized. Other findings: None. IMPRESSION: 1. Cholelithiasis with wall thickening and positive sonographic Murphy sign, findings indicative of acute cholecystitis. Associated choledocholithiasis. These results will be called to the ordering clinician or representative by the Radiologist Assistant, and communication documented in the PACS or zVision Dashboard. Electronically Signed   By: Juliette AlcideMelinda  Blietz M.D.   On: 10/27/2017 08:24    Anti-infectives: Anti-infectives (From admission, onward)   Start     Dose/Rate Route Frequency Ordered Stop   10/27/17 1800  piperacillin-tazobactam (ZOSYN) IVPB 3.375 g  Status:  Discontinued     3.375 g 100 mL/hr over 30 Minutes Intravenous Every 8 hours 10/27/17 1700 10/27/17 1702   10/27/17 1800  piperacillin-tazobactam (ZOSYN) IVPB 3.375 g     3.375  g 12.5 mL/hr over 240 Minutes Intravenous Every 8 hours 10/27/17 1702     10/27/17 1200  piperacillin-tazobactam (ZOSYN) IVPB 3.375 g  Status:  Discontinued     3.375 g 12.5 mL/hr over 240 Minutes Intravenous Every 6 hours 10/27/17 0904 10/27/17 0907   10/27/17 1000  piperacillin-tazobactam (ZOSYN) IVPB 3.375 g  Status:  Discontinued     3.375 g 12.5 mL/hr over 240 Minutes Intravenous Every 8 hours 10/27/17 0909 10/27/17 0940       Assessment/Plan Cholecystitis - going for ERCP today. Ok for clear liquids after this procedure from our standpoint - continue IV zosyn - Will check labs tomorrow morning and plan to proceed with lap chole tomorrow. NPO after MN.  ID - zosyn 2/13>> FEN - IVF, NPO for procedure VTE - SCDs Foley - none Follow up - TBD    LOS: 2 days    Franne Forts , [facility] 10/28/2017, 8:39 AM Pager: [phone] Consults: [phone] Mon-Fri 7:00 am-4:30 pm Sat-Sun 7:00 am-11:30 am

## 2017-10-29 ENCOUNTER — Encounter (HOSPITAL_COMMUNITY): Payer: Self-pay | Admitting: Certified Registered"

## 2017-10-29 ENCOUNTER — Encounter (HOSPITAL_COMMUNITY)
Admission: EM | Disposition: A | Discharge status: Home / Self Care | Payer: Self-pay | Source: Home / Self Care | Attending: Internal Medicine

## 2017-10-29 ENCOUNTER — Inpatient Hospital Stay (HOSPITAL_COMMUNITY): Payer: Commercial Managed Care - PPO | Admitting: Certified Registered"

## 2017-10-29 DIAGNOSIS — K210 Gastro-esophageal reflux disease with esophagitis: Secondary | ICD-10-CM

## 2017-10-29 DIAGNOSIS — K8042 Calculus of bile duct with acute cholecystitis without obstruction: Secondary | ICD-10-CM

## 2017-10-29 HISTORY — PX: CHOLECYSTECTOMY: SHX55

## 2017-10-29 LAB — CBC
HCT: 41.5 % (ref 39.0–52.0)
Hemoglobin: 14.2 g/dL (ref 13.0–17.0)
MCH: 31.1 pg (ref 26.0–34.0)
MCHC: 34.2 g/dL (ref 30.0–36.0)
MCV: 90.8 fL (ref 78.0–100.0)
PLATELETS: 320 10*3/uL (ref 150–400)
RBC: 4.57 MIL/uL (ref 4.22–5.81)
RDW: 13.1 % (ref 11.5–15.5)
WBC: 14.4 10*3/uL — ABNORMAL HIGH (ref 4.0–10.5)

## 2017-10-29 LAB — COMPREHENSIVE METABOLIC PANEL
ALT: 147 U/L — AB (ref 17–63)
AST: 65 U/L — ABNORMAL HIGH (ref 15–41)
Albumin: 3.1 g/dL — ABNORMAL LOW (ref 3.5–5.0)
Alkaline Phosphatase: 214 U/L — ABNORMAL HIGH (ref 38–126)
Anion gap: 10 (ref 5–15)
BUN: 5 mg/dL — ABNORMAL LOW (ref 6–20)
CHLORIDE: 104 mmol/L (ref 101–111)
CO2: 24 mmol/L (ref 22–32)
Calcium: 8.9 mg/dL (ref 8.9–10.3)
Creatinine, Ser: 0.87 mg/dL (ref 0.61–1.24)
GFR calc non Af Amer: >60 mL/min (ref >60)
Glucose, Bld: 137 mg/dL — ABNORMAL HIGH (ref 65–99)
POTASSIUM: 3.8 mmol/L (ref 3.5–5.1)
SODIUM: 138 mmol/L (ref 135–145)
Total Bilirubin: 0.7 mg/dL (ref 0.3–1.2)
Total Protein: 6.8 g/dL (ref 6.5–8.1)

## 2017-10-29 LAB — SURGICAL PCR SCREEN
MRSA, PCR: NEGATIVE
STAPHYLOCOCCUS AUREUS: NEGATIVE

## 2017-10-29 LAB — LIPASE, BLOOD: LIPASE: 149 U/L — AB (ref 11–51)

## 2017-10-29 SURGERY — LAPAROSCOPIC CHOLECYSTECTOMY WITH INTRAOPERATIVE CHOLANGIOGRAM
Anesthesia: General | Site: Abdomen

## 2017-10-29 MED ORDER — ROCURONIUM BROMIDE 10 MG/ML (PF) SYRINGE
PREFILLED_SYRINGE | INTRAVENOUS | Status: AC
  Filled 2017-10-29: qty 5

## 2017-10-29 MED ORDER — PROPOFOL 10 MG/ML IV BOLUS
INTRAVENOUS | Status: DC | PRN
  Administered 2017-10-29: 200 mg via INTRAVENOUS

## 2017-10-29 MED ORDER — PROMETHAZINE HCL 25 MG/ML IJ SOLN: 6.2500 mg | INTRAMUSCULAR | Status: DC | PRN

## 2017-10-29 MED ORDER — ESMOLOL HCL 100 MG/10ML IV SOLN
INTRAVENOUS | Status: AC
  Filled 2017-10-29: qty 10

## 2017-10-29 MED ORDER — FENTANYL CITRATE (PF) 100 MCG/2ML IJ SOLN
25.0000 ug | INTRAMUSCULAR | Status: DC | PRN
  Administered 2017-10-29 (×2): 50 ug via INTRAVENOUS

## 2017-10-29 MED ORDER — ROCURONIUM BROMIDE 100 MG/10ML IV SOLN
INTRAVENOUS | Status: DC | PRN
  Administered 2017-10-29: 40 mg via INTRAVENOUS

## 2017-10-29 MED ORDER — 0.9 % SODIUM CHLORIDE (POUR BTL) OPTIME
TOPICAL | Status: DC | PRN
  Administered 2017-10-29: 1000 mL

## 2017-10-29 MED ORDER — LACTATED RINGERS IV SOLN
INTRAVENOUS | Status: DC | PRN
  Administered 2017-10-29: 07:00:00 via INTRAVENOUS

## 2017-10-29 MED ORDER — IOPAMIDOL (ISOVUE-300) INJECTION 61%
INTRAVENOUS | Status: AC
  Filled 2017-10-29: qty 50

## 2017-10-29 MED ORDER — PROPOFOL 10 MG/ML IV BOLUS
INTRAVENOUS | Status: AC
  Filled 2017-10-29: qty 20

## 2017-10-29 MED ORDER — DEXAMETHASONE SODIUM PHOSPHATE 10 MG/ML IJ SOLN
INTRAMUSCULAR | Status: AC
  Filled 2017-10-29: qty 1

## 2017-10-29 MED ORDER — MORPHINE SULFATE (PF) 4 MG/ML IV SOLN
1.0000 mg | INTRAVENOUS | Status: DC | PRN
  Administered 2017-10-29 – 2017-10-30 (×3): 4 mg via INTRAVENOUS
  Filled 2017-10-29 (×3): qty 1

## 2017-10-29 MED ORDER — ESMOLOL HCL 100 MG/10ML IV SOLN
INTRAVENOUS | Status: DC | PRN
Start: 2017-10-29 — End: 2017-10-29
  Administered 2017-10-29 (×2): 20 mg via INTRAVENOUS
  Administered 2017-10-29 (×2): 10 mg via INTRAVENOUS

## 2017-10-29 MED ORDER — ONDANSETRON HCL 4 MG/2ML IJ SOLN
INTRAMUSCULAR | Status: DC | PRN
  Administered 2017-10-29: 4 mg via INTRAVENOUS

## 2017-10-29 MED ORDER — LIDOCAINE 2% (20 MG/ML) 5 ML SYRINGE
INTRAMUSCULAR | Status: AC
  Filled 2017-10-29: qty 5

## 2017-10-29 MED ORDER — FENTANYL CITRATE (PF) 100 MCG/2ML IJ SOLN
INTRAMUSCULAR | Status: DC | PRN
  Administered 2017-10-29 (×5): 50 ug via INTRAVENOUS

## 2017-10-29 MED ORDER — BUPIVACAINE-EPINEPHRINE (PF) 0.5% -1:200000 IJ SOLN
INTRAMUSCULAR | Status: AC
  Filled 2017-10-29: qty 30

## 2017-10-29 MED ORDER — DEXAMETHASONE SODIUM PHOSPHATE 10 MG/ML IJ SOLN
INTRAMUSCULAR | Status: DC | PRN
  Administered 2017-10-29: 10 mg via INTRAVENOUS

## 2017-10-29 MED ORDER — MIDAZOLAM HCL 2 MG/2ML IJ SOLN
INTRAMUSCULAR | Status: AC
  Filled 2017-10-29: qty 2

## 2017-10-29 MED ORDER — SODIUM CHLORIDE 0.9 % IR SOLN
Status: DC | PRN
  Administered 2017-10-29: 1

## 2017-10-29 MED ORDER — SUGAMMADEX SODIUM 200 MG/2ML IV SOLN
INTRAVENOUS | Status: AC
  Filled 2017-10-29: qty 2

## 2017-10-29 MED ORDER — HEMOSTATIC AGENTS (NO CHARGE) OPTIME
TOPICAL | Status: DC | PRN
  Administered 2017-10-29 (×2): 1 via TOPICAL

## 2017-10-29 MED ORDER — FENTANYL CITRATE (PF) 100 MCG/2ML IJ SOLN
INTRAMUSCULAR | Status: AC
  Filled 2017-10-29: qty 2

## 2017-10-29 MED ORDER — BUPIVACAINE-EPINEPHRINE 0.25% -1:200000 IJ SOLN
INTRAMUSCULAR | Status: DC | PRN
Start: 2017-10-29 — End: 2017-10-29
  Administered 2017-10-29: 20 mL

## 2017-10-29 MED ORDER — SUGAMMADEX SODIUM 200 MG/2ML IV SOLN
INTRAVENOUS | Status: DC | PRN
  Administered 2017-10-29: 200 mg via INTRAVENOUS

## 2017-10-29 MED ORDER — MIDAZOLAM HCL 5 MG/5ML IJ SOLN
INTRAMUSCULAR | Status: DC | PRN
  Administered 2017-10-29: 2 mg via INTRAVENOUS

## 2017-10-29 MED ORDER — OXYCODONE HCL 5 MG PO TABS
ORAL_TABLET | ORAL | Status: AC
  Filled 2017-10-29: qty 2

## 2017-10-29 MED ORDER — KETOROLAC TROMETHAMINE 30 MG/ML IJ SOLN
INTRAMUSCULAR | Status: AC
  Filled 2017-10-29: qty 1

## 2017-10-29 MED ORDER — OXYCODONE HCL 5 MG PO TABS
5.0000 mg | ORAL_TABLET | ORAL | Status: DC | PRN
  Administered 2017-10-29 (×2): 10 mg via ORAL
  Filled 2017-10-29: qty 2

## 2017-10-29 MED ORDER — LIDOCAINE 2% (20 MG/ML) 5 ML SYRINGE
INTRAMUSCULAR | Status: DC | PRN
  Administered 2017-10-29: 100 mg via INTRAVENOUS

## 2017-10-29 MED ORDER — ONDANSETRON HCL 4 MG/2ML IJ SOLN
INTRAMUSCULAR | Status: AC
  Filled 2017-10-29: qty 2

## 2017-10-29 MED ORDER — FENTANYL CITRATE (PF) 250 MCG/5ML IJ SOLN
INTRAMUSCULAR | Status: AC
  Filled 2017-10-29: qty 5

## 2017-10-29 SURGICAL SUPPLY — 42 items
ADH SKN CLS APL DERMABOND .7 (GAUZE/BANDAGES/DRESSINGS) ×1
APPLIER CLIP 5 13 M/L LIGAMAX5 (MISCELLANEOUS) ×3
APR CLP MED LRG 5 ANG JAW (MISCELLANEOUS) ×1
BAG SPEC RTRVL LRG 6X4 10 (ENDOMECHANICALS) ×1
BLADE CLIPPER SURG (BLADE) ×2 IMPLANT
CANISTER SUCT 3000ML PPV (MISCELLANEOUS) ×3 IMPLANT
CHLORAPREP W/TINT 26ML (MISCELLANEOUS) ×3 IMPLANT
CLIP APPLIE 5 13 M/L LIGAMAX5 (MISCELLANEOUS) ×1 IMPLANT
COVER MAYO STAND STRL (DRAPES) IMPLANT
COVER SURGICAL LIGHT HANDLE (MISCELLANEOUS) ×3 IMPLANT
DERMABOND ADVANCED (GAUZE/BANDAGES/DRESSINGS) ×2
DERMABOND ADVANCED .7 DNX12 (GAUZE/BANDAGES/DRESSINGS) ×1 IMPLANT
DRAPE C-ARM 42X72 X-RAY (DRAPES) IMPLANT
ELECT REM PT RETURN 9FT ADLT (ELECTROSURGICAL) ×3
ELECTRODE REM PT RTRN 9FT ADLT (ELECTROSURGICAL) ×1 IMPLANT
GLOVE BIOGEL PI IND STRL 7.0 (GLOVE) IMPLANT
GLOVE BIOGEL PI INDICATOR 7.0 (GLOVE) ×6
GLOVE SURG SIGNA 7.5 PF LTX (GLOVE) ×3 IMPLANT
GLOVE SURG SS PI 6.5 STRL IVOR (GLOVE) ×6 IMPLANT
GOWN STRL REUS W/ TWL LRG LVL3 (GOWN DISPOSABLE) ×2 IMPLANT
GOWN STRL REUS W/ TWL XL LVL3 (GOWN DISPOSABLE) ×1 IMPLANT
GOWN STRL REUS W/TWL LRG LVL3 (GOWN DISPOSABLE) ×9
GOWN STRL REUS W/TWL XL LVL3 (GOWN DISPOSABLE) ×3
HEMOSTAT SNOW SURGICEL 2X4 (HEMOSTASIS) ×4 IMPLANT
KIT BASIN OR (CUSTOM PROCEDURE TRAY) ×3 IMPLANT
KIT ROOM TURNOVER OR (KITS) ×3 IMPLANT
NS IRRIG 1000ML POUR BTL (IV SOLUTION) ×3 IMPLANT
PAD ARMBOARD 7.5X6 YLW CONV (MISCELLANEOUS) ×3 IMPLANT
POUCH SPECIMEN RETRIEVAL 10MM (ENDOMECHANICALS) ×3 IMPLANT
SCISSORS LAP 5X35 DISP (ENDOMECHANICALS) ×3 IMPLANT
SET CHOLANGIOGRAPH 5 50 .035 (SET/KITS/TRAYS/PACK) IMPLANT
SET IRRIG TUBING LAPAROSCOPIC (IRRIGATION / IRRIGATOR) ×3 IMPLANT
SLEEVE ENDOPATH XCEL 5M (ENDOMECHANICALS) ×6 IMPLANT
SPECIMEN JAR SMALL (MISCELLANEOUS) ×3 IMPLANT
SUT MNCRL AB 4-0 PS2 18 (SUTURE) ×3 IMPLANT
TOWEL OR 17X24 6PK STRL BLUE (TOWEL DISPOSABLE) ×3 IMPLANT
TOWEL OR 17X26 10 PK STRL BLUE (TOWEL DISPOSABLE) ×3 IMPLANT
TRAY LAPAROSCOPIC MC (CUSTOM PROCEDURE TRAY) ×3 IMPLANT
TROCAR XCEL BLUNT TIP 100MML (ENDOMECHANICALS) ×3 IMPLANT
TROCAR XCEL NON-BLD 5MMX100MML (ENDOMECHANICALS) ×3 IMPLANT
TUBING INSUFFLATION (TUBING) ×3 IMPLANT
WATER STERILE IRR 1000ML POUR (IV SOLUTION) ×3 IMPLANT

## 2017-10-29 NOTE — Progress Notes (Signed)
   Patient Name: Bryan CookeyJonathan Flynn Date of Encounter: 10/29/2017, 11:47 AM    Subjective  Pt is resting comfortably, mom at bedside. Lap chole this AM, no complications at this time. No SOB, dyspnea, fever, chills, CP, dysuria, or N/V/D. He has some abdominal soreness and pain with deep breaths but reports this is mild. Doing well otherwise.    Objective  BP 128/81 (BP Location: Right Arm)   Pulse 76   Temp 99 F (37.2 C) (Oral)   Resp 14   Ht 5\' 11"  (1.803 m)   Wt 81.2 kg (179 lb)   SpO2 94%   BMI 24.97 kg/m   General:  Calm, comfortable, NAD Eyes:   anicteric Lungs:  CTAB Heart::  S1S2 no rubs, murmurs or gallops Abdomen:  soft and mildly tender, BS+, 3 sutures in the RUQ and 1 suture in the umbilicus at sites of incision, intact Ext:   no edema, cyanosis or clubbing    Assessment and Plan   1. Acute cholecystitis + choledocholithiasis- s/p ERCP/biliary sphincterotomy/balloon extraction 2/14 and lap chole 2/15. Pt is stable. IV Zosyn and pain control.   2. GERD with esophagitis- continue PPI  3. Hypokalemia/dehydration- IVF and replenish. Hospitalist following.   CBC and CMP pending.   Should be ready to sign off, will discuss with Dr. Leone PayorGessner before clearing him from our end. Can follow up outpatient in the future as needed.    [facility] Attending   I have taken an interval history, reviewed the chart and examined the patient. I agree with the Advanced Practitioner's note, impression and recommendations.   He looks good.  Advised he take daily PPI at dc - he can get Prevacid 15 mg - (had headache with Nexium)  Signing off  Iva Booparl E. Gessner, MD, [facility] [phone]) 10/29/2017 3:52 PM

## 2017-10-29 NOTE — Progress Notes (Signed)
PROGRESS NOTE  Bryan CookeyJonathan Flynn XLK:[id number] DOB: 02/11/1971 DOA: 10/25/2017 PCP: Patient, No Pcp Per   LOS: 3 days   Brief Narrative / Interim history: 47 y.o.malewith medical history significant of prior esophageal surgery. Admitted for significant epigastric pain with possible esophageal mass and elevated LFTs and was found to have acute cholecystitis after EGD and biopsies done for possible esophageal mass on CT.  ERCP 2/14 and lap chole on 2/15  Assessment & Plan: Active Problems:   Epigastric pain   Abnormal LFTs   Hypokalemia   Acute epigastric pain   RUQ pain   Abnormal CT scan, esophagus   Reflux esophagitis   Choledocholithiasis with acute cholecystitis   Acute epigastric pain with RUQ pain due to acute cholecystitis -s/p EGD and biopsies for esophageal mass on CT Scan-- reflux and hiatal hernia found -ERCP 2/14, lap chole 2/15  -pain control -IV zosyn  Elevated LFTs -hepatitis panel pending -due to #1, improving  Hypokalemia -replace   Dehydration -IVF    DVT prophylaxis: SCDs Code Status: Full code Family Communication: family present at bedside Disposition Plan: home 1 day  Consultants:   General surgery   Procedures:   ERCP 2/14  Lap chole 2/15  Antimicrobials:  Zosyn 2/13 >>   Subjective: -seen post op, groggy but appropriate  Objective: Vitals:   10/29/17 0915 10/29/17 0930 10/29/17 0945 10/29/17 0959  BP: 138/86 136/80 134/76 128/81  Pulse: 79 76 78 76  Resp: 17 18 20 14   Temp:   98.2 F (36.8 C) 99 F (37.2 C)  TempSrc:    Oral  SpO2: 97% 96% 97% 94%  Weight:      Height:        Intake/Output Summary (Last 24 hours) at 10/29/2017 1102 Last data filed at 10/29/2017 0834 Gross per 24 hour  Intake 3005 ml  Output 1580 ml  Net 1425 ml   Filed Weights   10/26/17 0343 10/26/17 1427 10/28/17 0944  Weight: 81.4 kg (179 lb 7.3 oz) 81.2 kg (179 lb) 81.2 kg (179 lb)    Examination:  Constitutional: NAD Respiratory:  CTA Cardiovascular: RRR   Data Reviewed: I have independently reviewed following labs and imaging studies   CBC: Recent Labs  Lab 10/25/17 2008 10/26/17 0253 10/27/17 0742  WBC 13.0* 13.8* 13.7*  HGB 16.2 15.5 13.8  HCT 45.8 44.6 40.4  MCV 90.9 90.5 91.4  PLT 276 286 266   Basic Metabolic Panel: Recent Labs  Lab 10/25/17 2008 10/26/17 0253 10/27/17 0742  NA 137 136 136  K 3.3* 3.7 3.4*  CL 100* 103 104  CO2 24 22 23   GLUCOSE 106* 120* 113*  BUN 6 <5* 5*  CREATININE 1.03 0.93 0.76  CALCIUM 9.3 8.9 8.7*  MG 2.1 2.0  --   PHOS 3.5 2.9  --    GFR: Estimated Creatinine Clearance: 122.9 mL/min (by C-G formula based on SCr of 0.76 mg/dL). Liver Function Tests: Recent Labs  Lab 10/25/17 2008 10/26/17 0253 10/27/17 0742  AST 86* 128* 95*  ALT 242* 249* 198*  ALKPHOS 162* 196* 222*  BILITOT 1.2 2.1* 3.6*  PROT 7.5 7.0 6.2*  ALBUMIN 3.9 3.8 3.3*   Recent Labs  Lab 10/25/17 2008  LIPASE 40   No results for input(s): AMMONIA in the last 168 hours. Coagulation Profile: No results for input(s): INR, PROTIME in the last 168 hours. Cardiac Enzymes: Recent Labs  Lab 10/25/17 2008  CKTOTAL 54   BNP (last 3 results) No results for input(s):  PROBNP in the last 8760 hours. HbA1C: No results for input(s): HGBA1C in the last 72 hours. CBG: No results for input(s): GLUCAP in the last 168 hours. Lipid Profile: No results for input(s): CHOL, HDL, LDLCALC, TRIG, CHOLHDL, LDLDIRECT in the last 72 hours. Thyroid Function Tests: No results for input(s): TSH, T4TOTAL, FREET4, T3FREE, THYROIDAB in the last 72 hours. Anemia Panel: No results for input(s): VITAMINB12, FOLATE, FERRITIN, TIBC, IRON, RETICCTPCT in the last 72 hours. Urine analysis:    Component Value Date/Time   COLORURINE YELLOW 10/25/2017 2008   APPEARANCEUR CLEAR 10/25/2017 2008   LABSPEC 1.015 10/25/2017 2008   PHURINE 6.0 10/25/2017 2008   GLUCOSEU NEGATIVE 10/25/2017 2008   HGBUR SMALL (A)  10/25/2017 2008   BILIRUBINUR NEGATIVE 10/25/2017 2008   KETONESUR NEGATIVE 10/25/2017 2008   PROTEINUR NEGATIVE 10/25/2017 2008   NITRITE NEGATIVE 10/25/2017 2008   LEUKOCYTESUR TRACE (A) 10/25/2017 2008   Sepsis Labs: Invalid input(s): PROCALCITONIN, LACTICIDVEN  Recent Results (from the past 240 hour(s))  Surgical pcr screen     Status: None   Collection Time: 10/29/17 12:15 AM  Result Value Ref Range Status   MRSA, PCR NEGATIVE NEGATIVE Final   Staphylococcus aureus NEGATIVE NEGATIVE Final    Comment: (NOTE) The Xpert SA Assay (FDA approved for NASAL specimens in patients 30 years of age and older), is one component of a comprehensive surveillance program. It is not intended to diagnose infection nor to guide or monitor treatment. Performed at [facility], [address] [place], [place] [address]       Radiology Studies: Dg Ercp Biliary & Pancreatic Ducts  Result Date: 10/28/2017 CLINICAL DATA:  Gallstone EXAM: ERCP TECHNIQUE: Multiple spot images obtained with the fluoroscopic device and submitted for interpretation post-procedure. FLUOROSCOPY TIME:  Fluoroscopy Time:  2 minutes and 2 seconds Radiation Exposure Index (if provided by the fluoroscopic device): Number of Acquired Spot Images: 0 COMPARISON:  None. FINDINGS: There is a large stone in the distal common bile duct. Balloon stone retrieval is documented with effectiveness. IMPRESSION: See above. These images were submitted for radiologic interpretation only. Please see the procedural report for the amount of contrast and the fluoroscopy time utilized. Electronically Signed   By: Jolaine Click M.D.   On: 10/28/2017 13:25     Scheduled Meds: . fentaNYL      . indomethacin  100 mg Rectal Once  . oxyCODONE      . pantoprazole (PROTONIX) IV  40 mg Intravenous Q12H   Continuous Infusions: . 0.9 % NaCl with KCl 20 mEq / L 100 mL/hr at 10/29/17 0517  . piperacillin-tazobactam (ZOSYN)  IV 3.375 g (10/29/17 0516)      Pamella Pert, MD, PhD [facility] Pager [phone]  If 7PM-7AM, please contact night-coverage [email or web address] Password [email or web address] 10/29/2017, 11:02 AM

## 2017-10-29 NOTE — Anesthesia Postprocedure Evaluation (Signed)
Anesthesia Post Note  Patient: Bryan CookeyJonathan Flynn  Procedure(s) Performed: LAPAROSCOPIC CHOLECYSTECTOMY (N/A Abdomen)     Patient location during evaluation: PACU Anesthesia Type: General Level of consciousness: awake and alert Pain management: pain level controlled Vital Signs Assessment: post-procedure vital signs reviewed and stable Respiratory status: spontaneous breathing, nonlabored ventilation and respiratory function stable Cardiovascular status: blood pressure returned to baseline and stable Postop Assessment: no apparent nausea or vomiting Anesthetic complications: no    Last Vitals:  Vitals:   10/29/17 0915 10/29/17 0930  BP: 138/86 136/80  Pulse: 79 76  Resp: 17 18  Temp:    SpO2: 97% 96%    Last Pain:  Vitals:   10/29/17 0930  TempSrc:   PainSc: 6                  Cecile HearingStephen Edward Turk

## 2017-10-29 NOTE — Anesthesia Procedure Notes (Signed)
Procedure Name: Intubation Date/Time: 10/29/2017 7:27 AM Performed by: Marny Lowensteinapozzi, Jazzy Parmer W, CRNA Pre-anesthesia Checklist: Patient identified, Emergency Drugs available, Suction available, Patient being monitored and Timeout performed Patient Re-evaluated:Patient Re-evaluated prior to induction Oxygen Delivery Method: Circle system utilized Preoxygenation: Pre-oxygenation with 100% oxygen Induction Type: IV induction Ventilation: Mask ventilation without difficulty Laryngoscope Size: Miller and 2 Grade View: Grade I Tube type: Oral Tube size: 8.0 mm Number of attempts: 1 Placement Confirmation: ETT inserted through vocal cords under direct vision,  positive ETCO2,  CO2 detector and breath sounds checked- equal and bilateral Secured at: 23 cm Tube secured with: Tape Dental Injury: Teeth and Oropharynx as per pre-operative assessment

## 2017-10-29 NOTE — Op Note (Signed)
Laparoscopic Cholecystectomy Procedure Note  Indications: This patient presents with symptomatic gallbladder disease.  He was found preoperatively have findings consistent with a stone in the bile duct.  He underwent an ERCP yesterday with removal of the stone from the bile duct.  He now presents for cholecystectomy.  Pre-operative Diagnosis: symptomatic cholelithiasis  Post-operative Diagnosis: acute cholecystitis with cholelithiasis  Surgeon: Abigail MiyamotoBLACKMAN,Burl Tauzin A   Assistants: 0  Anesthesia: General endotracheal anesthesia  ASA Class: 2  Procedure Details  The patient was seen again in the Holding Room. The risks, benefits, complications, treatment options, and expected outcomes were discussed with the patient. The possibilities of reaction to medication, pulmonary aspiration, perforation of viscus, bleeding, recurrent infection, finding a normal gallbladder, the need for additional procedures, failure to diagnose a condition, the possible need to convert to an open procedure, and creating a complication requiring transfusion or operation were discussed with the patient. The likelihood of improving the patient's symptoms with return to their baseline status is good.  The patient and/or family concurred with the proposed plan, giving informed consent. The site of surgery properly noted. The patient was taken to Operating Room, identified as Bryan Flynn and the procedure verified as Laparoscopic Cholecystectomy with Intraoperative Cholangiogram. A Time Out was held and the above information confirmed.  Prior to the induction of general anesthesia, antibiotic prophylaxis was administered. General endotracheal anesthesia was then administered and tolerated well. After the induction, the abdomen was prepped with Chloraprep and draped in sterile fashion. The patient was positioned in the supine position.  Local anesthetic agent was injected into the skin near the umbilicus and an incision made. We  dissected down to the abdominal fascia with blunt dissection.  The fascia was incised vertically and we entered the peritoneal cavity bluntly.  A pursestring suture of 0-Vicryl was placed around the fascial opening.  The Hasson cannula was inserted and secured with the stay suture.  Pneumoperitoneum was then created with CO2 and tolerated well without any adverse changes in the patient's vital signs. A 5-mm port was placed in the subxiphoid position.  Two 5-mm ports were placed in the right upper quadrant. All skin incisions were infiltrated with a local anesthetic agent before making the incision and placing the trocars.   We positioned the patient in reverse Trendelenburg, tilted slightly to the patient's left.  The gallbladder was found to be distended and acutely inflamed.  The gallbladder  fundus grasped and retracted cephalad.  The gallbladder had areas of necrosis and easily tore spilling some stones.  Adhesions were lysed bluntly and with the electrocautery where indicated, taking care not to injure any adjacent organs or viscus. The infundibulum was grasped and retracted laterally, exposing the peritoneum overlying the triangle of Calot. This was then divided and exposed in a blunt fashion. The cystic duct was clearly identified and bluntly dissected circumferentially. A critical view of the cystic duct and cystic artery was obtained.  The cystic duct was then ligated with clips and divided. The cystic artery was, dissected free, ligated with clips and divided as well.   The gallbladder was dissected from the liver bed in retrograde fashion with the electrocautery. The gallbladder was removed along with all the stones that are fallen out that I can see and placed in an Endocatch sac. The liver bed was irrigated and inspected. Hemostasis was achieved with the electrocautery as well as 2 pieces of surgical snow. Copious irrigation was utilized and was repeatedly aspirated until clear.  The gallbladder and  Endocatch  sac were then removed through the umbilical port site.  The pursestring suture was used to close the umbilical fascia.  I placed a second 0 Vicryl fascial suture as well as I had to open up the fascia further to remove the large gallbladder and large gallstones.  We again inspected the right upper quadrant for hemostasis.  Pneumoperitoneum was released as we removed the trocars.  4-0 Monocryl was used to close the skin.  Skin glue was then applied. The patient was then extubated and brought to the recovery room in stable condition. Instrument, sponge, and needle counts were correct at closure and at the conclusion of the case.   Findings: Acute Cholecystitis with Cholelithiasis  Estimated Blood Loss: Minimal         Drains: 0         Specimens: Gallbladder           Complications: None; patient tolerated the procedure well.         Disposition: PACU - hemodynamically stable.         Condition: stable

## 2017-10-29 NOTE — Transfer of Care (Signed)
Immediate Anesthesia Transfer of Care Note  Patient: Bryan Flynn  Procedure(s) Performed: LAPAROSCOPIC CHOLECYSTECTOMY (N/A Abdomen)  Patient Location: PACU  Anesthesia Type:General  Level of Consciousness: drowsy  Airway & Oxygen Therapy: Patient Spontanous Breathing and Patient connected to face mask oxygen  Post-op Assessment: Report given to RN and Post -op Vital signs reviewed and stable  Post vital signs: Reviewed and stable  Last Vitals:  Vitals:   10/29/17 0244 10/29/17 0516  BP: 122/82 131/80  Pulse: 74 75  Resp: 17 16  Temp: 36.7 C 36.8 C  SpO2: 98% 100%    Last Pain:  Vitals:   10/29/17 0516  TempSrc: Oral  PainSc:          Complications: No apparent anesthesia complications

## 2017-10-29 NOTE — Discharge Instructions (Signed)
Please arrive at least 30 min before your appointment to complete your check in paperwork.  If you are unable to arrive 30 min prior to your appointment time we may have to cancel or reschedule you. ° °LAPAROSCOPIC SURGERY: POST OP INSTRUCTIONS  °1. DIET: Follow a light bland diet the first 24 hours after arrival home, such as soup, liquids, crackers, etc. Be sure to include lots of fluids daily. Avoid fast food or heavy meals as your are more likely to get nauseated. Eat a low fat the next few days after surgery.  °2. Take your usually prescribed home medications unless otherwise directed. °3. PAIN CONTROL:  °1. Pain is best controlled by a usual combination of three different methods TOGETHER:  °1. Ice/Heat °2. Over the counter pain medication °3. Prescription pain medication °2. Most patients will experience some swelling and bruising around the incisions. Ice packs or heating pads (30-60 minutes up to 6 times a day) will help. Use ice for the first few days to help decrease swelling and bruising, then switch to heat to help relax tight/sore spots and speed recovery. Some people prefer to use ice alone, heat alone, alternating between ice & heat. Experiment to what works for you. Swelling and bruising can take several weeks to resolve.  °3. It is helpful to take an over-the-counter pain medication regularly for the first few weeks. Choose one of the following that works best for you:  °1. Naproxen (Aleve, etc) Two 220mg tabs twice a day °2. Ibuprofen (Advil, etc) Three 200mg tabs four times a day (every meal & bedtime) °3. Acetaminophen (Tylenol, etc) 500-650mg four times a day (every meal & bedtime) °4. A prescription for pain medication (such as oxycodone, hydrocodone, etc) should be given to you upon discharge. Take your pain medication as prescribed.  °1. If you are having problems/concerns with the prescription medicine (does not control pain, nausea, vomiting, rash, itching, etc), please call us [phone] to see if we need to switch you to a different pain medicine that will work better for you and/or control your side effect better. °2. If you need a refill on your pain medication, please contact your pharmacy. They will contact our office to request authorization. Prescriptions will not be filled after 5 pm or on week-ends. °4. Avoid getting constipated. Between the surgery and the pain medications, it is common to experience some constipation. Increasing fluid intake and taking a fiber supplement (such as Metamucil, Citrucel, FiberCon, MiraLax, etc) 1-2 times a day regularly will usually help prevent this problem from occurring. A mild laxative (prune juice, Milk of Magnesia, MiraLax, etc) should be taken according to package directions if there are no bowel movements after 48 hours.  °5. Watch out for diarrhea. If you have many loose bowel movements, simplify your diet to bland foods & liquids for a few days. Stop any stool softeners and decrease your fiber supplement. Switching to mild anti-diarrheal medications (Kayopectate, Pepto Bismol) can help. If this worsens or does not improve, please call us. °6. Wash / shower every day. You may shower over the dressings as they are waterproof. Continue to shower over incision(s) after the dressing is off. °7. Remove your waterproof bandages 5 days after surgery. You may leave the incision open to air. You may replace a dressing/Band-Aid to cover the incision for comfort if you wish.  °8. ACTIVITIES as tolerated:  °1. You may resume regular (light) daily activities beginning the next day--such as daily self-care, walking, climbing stairs--gradually   increasing activities as tolerated. If you can walk 30 minutes without difficulty, it is safe to try more intense activity such as jogging, treadmill, bicycling, low-impact aerobics, swimming, etc. 2. Save the most intensive and strenuous activity for last such as sit-ups, heavy lifting, contact sports, etc Refrain  from any heavy lifting or straining until you are off narcotics for pain control.  3. DO NOT PUSH THROUGH PAIN. Let pain be your guide: If it hurts to do something, don't do it. Pain is your body warning you to avoid that activity for another week until the pain goes down. 4. You may drive when you are no longer taking prescription pain medication, you can comfortably wear a seatbelt, and you can safely maneuver your car and apply brakes. 5. You may have sexual intercourse when it is comfortable.  9. FOLLOW UP in our office  1. Please call [facility] at [phone] to set up an appointment to see your surgeon in the office for a follow-up appointment approximately 2-3 weeks after your surgery. 2. Make sure that you call for this appointment the day you arrive home to insure a convenient appointment time.      10. IF YOU HAVE DISABILITY OR FAMILY LEAVE FORMS, BRING THEM TO THE               OFFICE FOR PROCESSING.   WHEN TO CALL US [phone]:  1. Poor pain control 2. Reactions / problems with new medications (rash/itching, nausea, etc)  3. Fever over 101.5 F (38.5 C) 4. Inability to urinate 5. Nausea and/or vomiting 6. Worsening swelling or bruising 7. Continued bleeding from incision. 8. Increased pain, redness, or drainage from the incision  The clinic staff is available to answer your questions during regular business hours (8:30am-5pm). Please dont hesitate to call and ask to speak to one of our nurses for clinical concerns.  If you have a medical emergency, go to the nearest emergency room or call 911.  [facility] from [facility] is always on call at the [facility]   [facility], [place]  [address], [address], [place], [place] [address] ?  MAIN: [phone] ? TOLL FREE: [phone] ?  FAX [phone]  [email or web address]    FOR ACID REFLUX PURCHASE PREVACID (LANSOPRAZOLE GENERIC OK) 15 MG AND TAKE ONE EACH DAY TO TREAT REFLUX  ESOPHAGITIS

## 2017-10-29 NOTE — Anesthesia Preprocedure Evaluation (Addendum)
Anesthesia Evaluation  Patient identified by MRN, date of birth, ID band Patient awake    Reviewed: Allergy & Precautions, NPO status , Patient's Chart, lab work & pertinent test results  History of Anesthesia Complications Negative for: history of anesthetic complications  Airway Mallampati: I  TM Distance: >3 FB Neck ROM: Full    Dental  (+) Teeth Intact, Dental Advisory Given, Chipped   Pulmonary asthma , Current Smoker,    Pulmonary exam normal breath sounds clear to auscultation       Cardiovascular Exercise Tolerance: Good negative cardio ROS Normal cardiovascular exam Rhythm:Regular Rate:Normal     Neuro/Psych negative neurological ROS  negative psych ROS   GI/Hepatic GERD  ,Acute cholecystitis   Endo/Other  negative endocrine ROS  Renal/GU negative Renal ROS     Musculoskeletal negative musculoskeletal ROS (+)   Abdominal   Peds  Hematology negative hematology ROS (+)   Anesthesia Other Findings Day of surgery medications reviewed with the patient.  Reproductive/Obstetrics                            Anesthesia Physical Anesthesia Plan  ASA: II  Anesthesia Plan: General   Post-op Pain Management:    Induction: Intravenous  PONV Risk Score and Plan: 2 and Dexamethasone, Ondansetron and Midazolam  Airway Management Planned: Oral ETT  Additional Equipment:   Intra-op Plan:   Post-operative Plan: Extubation in OR  Informed Consent: I have reviewed the patients History and Physical, chart, labs and discussed the procedure including the risks, benefits and alternatives for the proposed anesthesia with the patient or authorized representative who has indicated his/her understanding and acceptance.   Dental advisory given  Plan Discussed with: CRNA  Anesthesia Plan Comments: (Risks/benefits of general anesthesia discussed with patient including risk of damage to teeth,  lips, gum, and tongue, nausea/vomiting, allergic reactions to medications, and the possibility of heart attack, stroke and death.  All patient questions answered.  Patient wishes to proceed.)        Anesthesia Quick Evaluation

## 2017-10-30 ENCOUNTER — Encounter (HOSPITAL_COMMUNITY): Payer: Self-pay | Admitting: Surgery

## 2017-10-30 LAB — CBC
HEMATOCRIT: 38.3 % — AB (ref 39.0–52.0)
Hemoglobin: 13.3 g/dL (ref 13.0–17.0)
MCH: 31.4 pg (ref 26.0–34.0)
MCHC: 34.7 g/dL (ref 30.0–36.0)
MCV: 90.5 fL (ref 78.0–100.0)
Platelets: 293 10*3/uL (ref 150–400)
RBC: 4.23 MIL/uL (ref 4.22–5.81)
RDW: 13.2 % (ref 11.5–15.5)
WBC: 15.3 10*3/uL — ABNORMAL HIGH (ref 4.0–10.5)

## 2017-10-30 LAB — COMPREHENSIVE METABOLIC PANEL
ALT: 124 U/L — ABNORMAL HIGH (ref 17–63)
ANION GAP: 11 (ref 5–15)
AST: 51 U/L — ABNORMAL HIGH (ref 15–41)
Albumin: 3 g/dL — ABNORMAL LOW (ref 3.5–5.0)
Alkaline Phosphatase: 176 U/L — ABNORMAL HIGH (ref 38–126)
BILIRUBIN TOTAL: 0.8 mg/dL (ref 0.3–1.2)
BUN: 6 mg/dL (ref 6–20)
CO2: 22 mmol/L (ref 22–32)
Calcium: 8.9 mg/dL (ref 8.9–10.3)
Chloride: 104 mmol/L (ref 101–111)
Creatinine, Ser: 0.8 mg/dL (ref 0.61–1.24)
Glucose, Bld: 105 mg/dL — ABNORMAL HIGH (ref 65–99)
POTASSIUM: 4 mmol/L (ref 3.5–5.1)
Sodium: 137 mmol/L (ref 135–145)
TOTAL PROTEIN: 6.4 g/dL — AB (ref 6.5–8.1)

## 2017-10-30 MED ORDER — HYDROCODONE-ACETAMINOPHEN 5-325 MG PO TABS: 1.0000 | ORAL_TABLET | Freq: Four times a day (QID) | ORAL | 0 refills | Status: DC | PRN

## 2017-10-30 MED ORDER — LANSOPRAZOLE 15 MG PO CPDR: 15.0000 mg | DELAYED_RELEASE_CAPSULE | Freq: Every day | ORAL | 0 refills | Status: AC

## 2017-10-30 NOTE — Progress Notes (Signed)
1 Day Post-Op  Subjective: Feels well.  Pain controlled.  Tolerating a diet  Objective: Vital signs in last 24 hours: Temp:  [98.2 F (36.8 C)-99.1 F (37.3 C)] 98.6 F (37 C) (02/16 0513) Pulse Rate:  [76-84] 80 (02/16 0513) Resp:  [14-20] 18 (02/16 0513) BP: (122-136)/(76-84) 122/76 (02/16 0513) SpO2:  [94 %-98 %] 98 % (02/16 0513)   Intake/Output from previous day: 02/15 0701 - 02/16 0700 In: 3045 [P.O.:420; I.V.:2525; IV Piggyback:100] Out: 3050 [Urine:3025; Blood:25] Intake/Output this shift: No intake/output data recorded.   General appearance: alert and cooperative GI: normal findings: soft, non-tender  Incision: C/D/I  Lab Results:  Recent Labs    10/29/17 1157 10/30/17 0358  WBC 14.4* 15.3*  HGB 14.2 13.3  HCT 41.5 38.3*  PLT 320 293   BMET Recent Labs    10/29/17 1157 10/30/17 0358  NA 138 137  K 3.8 4.0  CL 104 104  CO2 24 22  GLUCOSE 137* 105*  BUN 5* 6  CREATININE 0.87 0.80  CALCIUM 8.9 8.9   PT/INR No results for input(s): LABPROT, INR in the last 72 hours. ABG No results for input(s): PHART, HCO3 in the last 72 hours.  Invalid input(s): PCO2, PO2  MEDS, Scheduled . indomethacin  100 mg Rectal Once  . pantoprazole (PROTONIX) IV  40 mg Intravenous Q12H    Studies/Results: Dg Ercp Biliary & Pancreatic Ducts  Result Date: 10/28/2017 CLINICAL DATA:  Gallstone EXAM: ERCP TECHNIQUE: Multiple spot images obtained with the fluoroscopic device and submitted for interpretation post-procedure. FLUOROSCOPY TIME:  Fluoroscopy Time:  2 minutes and 2 seconds Radiation Exposure Index (if provided by the fluoroscopic device): Number of Acquired Spot Images: 0 COMPARISON:  None. FINDINGS: There is a large stone in the distal common bile duct. Balloon stone retrieval is documented with effectiveness. IMPRESSION: See above. These images were submitted for radiologic interpretation only. Please see the procedural report for the amount of contrast and the  fluoroscopy time utilized. Electronically Signed   By: Jolaine ClickArthur  Hoss M.D.   On: 10/28/2017 13:25    Assessment: s/p Procedure(s): LAPAROSCOPIC CHOLECYSTECTOMY Patient Active Problem List   Diagnosis Date Noted  . Choledocholithiasis with acute cholecystitis   . Epigastric pain 10/26/2017  . Abnormal LFTs 10/26/2017  . Hypokalemia 10/26/2017  . Acute epigastric pain 10/26/2017  . RUQ pain   . Abnormal CT scan, esophagus   . Reflux esophagitis     Expected post op course  Plan: Discharge  Pain Rx in chart F/u and d/c info in chart   LOS: 4 days     .Vanita PandaAlicia C Ryder Chesmore, MD [facility], [place] [phone]   10/30/2017 9:16 AM

## 2017-10-30 NOTE — Progress Notes (Signed)
Pharmacy Antibiotic Note  Bryan Flynn is a 47 y.o. male admitted on 10/25/2017 with intra-abdominal infection.  Pharmacy has been consulted for Zosyn dosing.  Day #4 of Zosyn. Afebrile, WBC up at 15.3. S/p lap chole on 2/15. No cultures available. PCR negative. SCr stable.  Plan: Continue Zosyn 3.375 gm IV q8h (4 hour infusion) Monitor clinical picture, renal function F/U C&S, abx deescalation / LOT   Height: 5\' 11"  (180.3 cm) Weight: 179 lb (81.2 kg) IBW/kg (Calculated) : 75.3  Temp (24hrs), Avg:98.6 F (37 C), Min:97.6 F (36.4 C), Max:99.1 F (37.3 C)  Recent Labs  Lab 10/25/17 2008 10/26/17 0253 10/27/17 0742 10/29/17 1157 10/30/17 0358  WBC 13.0* 13.8* 13.7* 14.4* 15.3*  CREATININE 1.03 0.93 0.76 0.87 0.80    Estimated Creatinine Clearance: 122.9 mL/min (by C-G formula based on SCr of 0.8 mg/dL).    No Known Allergies  Thank you for allowing pharmacy to be a part of this patient's care.  Link SnufferJessica Yeriel Mineo, PharmD, BCPS, BCCCP Clinical Pharmacist Clinical phone 10/30/2017 until 3:30PM [phone]- [phone] After hours, please call #[phone] 10/30/2017 7:57 AM

## 2017-10-30 NOTE — Progress Notes (Signed)
Pt discharged home in stable condition after going over discharge teaching with no concerns voiced. AVS and discharge script given before leaving unit 

## 2017-10-30 NOTE — Discharge Summary (Signed)
Physician Discharge Summary  Bryan Flynn RUE:[id number] DOB: 06/18/1971 DOA: 10/25/2017  PCP: Patient, No Pcp Per  Admit date: 10/25/2017 Discharge date: 10/30/2017  Admitted From: Home Disposition:  Home   Recommendations for Outpatient Follow-up:  1. Follow up with [facility] 12/09/17  2. Please obtain LFT and CBC in 1 week 3. Follow up with hepatitis panel, pending at time of discharge   Discharge Condition: Stable CODE STATUS: Full  Diet recommendation: Regular diet   Brief/Interim Summary: Bryan CookeyJonathan Krempasky is a 47 y.o.malewith medical history significant of prior esophageal surgery. He was admitted for significant epigastric pain with possible esophageal mass and elevated LFTs and was found to have acute cholecystitisafter EGD and biopsies done for possible esophageal mass on CT. He underwentERCP 2/14 and lap chole on 2/15.   Discharge Diagnoses:  Active Problems:   Epigastric pain   Abnormal LFTs   Hypokalemia   Acute epigastric pain   RUQ pain   Abnormal CT scan, esophagus   Reflux esophagitis   Choledocholithiasis with acute cholecystitis  Acute epigastric pain with RUQ pain due to acute cholecystitis -s/p EGD and biopsies for esophageal mass on CT Scan-- reflux and hiatal hernia found -s/p ERCP2/14 -s/p lap chole 2/15  -Stable for discharge from surgical standpoint. GI has recommended daily prevacid.   Elevated LFTs -Hepatitis panel pending -LFT trending down    Discharge Instructions  Discharge Instructions    Call MD for:  difficulty breathing, headache or visual disturbances   Complete by:  As directed    Call MD for:  extreme fatigue   Complete by:  As directed    Call MD for:  hives   Complete by:  As directed    Call MD for:  persistant dizziness or light-headedness   Complete by:  As directed    Call MD for:  persistant nausea and vomiting   Complete by:  As directed    Call MD for:  redness, tenderness, or signs of infection  (pain, swelling, redness, odor or green/yellow discharge around incision site)   Complete by:  As directed    Call MD for:  severe uncontrolled pain   Complete by:  As directed    Call MD for:  temperature >100.4   Complete by:  As directed    Discharge instructions   Complete by:  As directed    You were cared for by a hospitalist during your hospital stay. If you have any questions about your discharge medications or the care you received while you were in the hospital after you are discharged, you can call the unit and asked to speak with the hospitalist on call if the hospitalist that took care of you is not available. Once you are discharged, your primary care physician will handle any further medical issues. Please note that NO REFILLS for any discharge medications will be authorized once you are discharged, as it is imperative that you return to your primary care physician (or establish a relationship with a primary care physician if you do not have one) for your aftercare needs so that they can reassess your need for medications and monitor your lab values.   Increase activity slowly   Complete by:  As directed      Allergies as of 10/30/2017   No Known Allergies     Medication List    STOP taking these medications   GOODYS EXTRA STRENGTH PO   potassium chloride SA 20 MEQ tablet Commonly known as:  K-DUR,KLOR-CON  TAKE these medications   HYDROcodone-acetaminophen 5-325 MG tablet Commonly known as:  NORCO/VICODIN Take 1-2 tablets by mouth every 6 (six) hours as needed.   lansoprazole 15 MG capsule Commonly known as:  PREVACID Take 1 capsule (15 mg total) by mouth daily.   ondansetron 4 MG tablet Commonly known as:  ZOFRAN Take 1 tablet (4 mg total) by mouth every 8 (eight) hours as needed for nausea or vomiting.      Follow-up Information    [facility], [place]. Go on 12/09/2017.   Specialty:  General Surgery Why:  at 3pm. Please arrive 30 minutes prior  to complete paperwork. Please bring photo ID and insurance card Contact information: [address] [place] [address] [phone]         No Known Allergies  Consultations:  GI  General surgery    Procedures/Studies: US Abdomen Complete  Result Date: 10/27/2017 CLINICAL DATA:  Right upper quadrant pain. EXAM: ABDOMEN ULTRASOUND COMPLETE COMPARISON:  CT abdomen pelvis 10/25/2017. FINDINGS: Gallbladder: Numerous shadowing echogenic stones in the gallbladder, measuring up to 1.5 cm. Associated wall thickening, 7 mm. Positive sonographic Murphy sign. No pericholecystic fluid. Common bile duct: Diameter: 7 mm, dilated for age. A 7 mm echogenic lesion is seen within the common bile duct. Liver: No focal lesion identified. Within normal limits in parenchymal echogenicity. Portal vein is patent on color Doppler imaging with normal direction of blood flow towards the liver. IVC: No abnormality visualized. Pancreas: Partially obscured by bowel gas. Spleen: Size and appearance within normal limits. Right Kidney: Length: 13.2 cm. Echogenicity within normal limits. No mass or hydronephrosis visualized. Left Kidney: Length: 14.2 cm. Echogenicity within normal limits. No mass or hydronephrosis visualized. Abdominal aorta: No aneurysm visualized. Other findings: None. IMPRESSION: 1. Cholelithiasis with wall thickening and positive sonographic Murphy sign, findings indicative of acute cholecystitis. Associated choledocholithiasis. These results will be called to the ordering clinician or representative by the Radiologist Assistant, and communication documented in the PACS or zVision Dashboard. Electronically Signed   By: Leanna Battles M.D.   On: 10/27/2017 08:24   Ct Abdomen Pelvis W Contrast  Result Date: 10/25/2017 CLINICAL DATA:  Acute onset of umbilical abdominal pain and nausea. EXAM: CT ABDOMEN AND PELVIS WITH CONTRAST TECHNIQUE: Multidetector CT imaging of the  abdomen and pelvis was performed using the standard protocol following bolus administration of intravenous contrast. CONTRAST:  100 mL ISOVUE-300 IOPAMIDOL (ISOVUE-300) INJECTION 61% COMPARISON:  None. FINDINGS: Lower chest: The visualized lung bases are grossly clear. The visualized portions of the mediastinum are unremarkable. There is focal wall thickening and minimal calcification at the distal esophagus, with a few adjacent nodes measuring up to 7 mm in short axis. A 9 mm node is noted at the gastroesophageal junction. Malignancy is a concern. Hepatobiliary: The liver is unremarkable in appearance. The gallbladder is unremarkable in appearance. The common bile duct remains normal in caliber. Pancreas: The pancreas is within normal limits. Spleen: The spleen is unremarkable in appearance. Adrenals/Urinary Tract: The adrenal glands are unremarkable in appearance. The kidneys are within normal limits. There is no evidence of hydronephrosis. No renal or ureteral stones are identified. No perinephric stranding is seen. Stomach/Bowel: The stomach is unremarkable in appearance. The small bowel is within normal limits. The patient is status post appendectomy. The colon is unremarkable in appearance. Vascular/Lymphatic: Mild scattered calcification is seen along the distal abdominal aorta and its branches. A retroaortic left renal vein is noted. The inferior vena cava is  grossly unremarkable. No retroperitoneal lymphadenopathy is seen. No pelvic sidewall lymphadenopathy is identified. Reproductive: The bladder is mildly distended and grossly unremarkable. The prostate is normal in size. Other: No additional soft tissue abnormalities are seen. Musculoskeletal: No acute osseous abnormalities are identified. The visualized musculature is unremarkable in appearance. IMPRESSION: 1. No acute abnormality seen to explain the patient's symptoms. 2. Focal wall thickening and minimal calcification at the distal esophagus, with a  few adjacent nodes measuring up to 7 mm in short axis. 9 mm node noted at the gastroesophageal junction. This raises concern for malignancy. Endoscopy is recommended for further evaluation, when and as deemed clinically appropriate. Aortic Atherosclerosis (ICD10-I70.0). These results were called by telephone at the time of interpretation on 10/25/2017 at 10:17 pm to Dr. Margarita Grizzle, who verbally acknowledged these results. Electronically Signed   By: Roanna Raider M.D.   On: 10/25/2017 22:19   Dg Ercp Biliary & Pancreatic Ducts  Result Date: 10/28/2017 CLINICAL DATA:  Gallstone EXAM: ERCP TECHNIQUE: Multiple spot images obtained with the fluoroscopic device and submitted for interpretation post-procedure. FLUOROSCOPY TIME:  Fluoroscopy Time:  2 minutes and 2 seconds Radiation Exposure Index (if provided by the fluoroscopic device): Number of Acquired Spot Images: 0 COMPARISON:  None. FINDINGS: There is a large stone in the distal common bile duct. Balloon stone retrieval is documented with effectiveness. IMPRESSION: See above. These images were submitted for radiologic interpretation only. Please see the procedural report for the amount of contrast and the fluoroscopy time utilized. Electronically Signed   By: Jolaine Click M.D.   On: 10/28/2017 13:25   ERCP Impression:       - Choledocholithiasis was found. Complete removal                            was accomplished by biliary sphincterotomy and                            balloon extraction.   Discharge Exam: Vitals:   10/30/17 0513 10/30/17 0845  BP: 122/76 126/86  Pulse: 80 73  Resp: 18 18  Temp: 98.6 F (37 C) 97.9 F (36.6 C)  SpO2: 98% 99%   Vitals:   10/29/17 1732 10/29/17 2203 10/30/17 0513 10/30/17 0845  BP: 129/77 131/84 122/76 126/86  Pulse: 84 80 80 73  Resp: 16 18 18 18   Temp: 99.1 F (37.3 C) 98.7 F (37.1 C) 98.6 F (37 C) 97.9 F (36.6 C)  TempSrc: Oral Oral Oral Oral  SpO2: 98% 97% 98% 99%  Weight:      Height:         General: Pt is alert, awake, not in acute distress Cardiovascular: RRR, S1/S2 +, no rubs, no gallops Respiratory: CTA bilaterally, no wheezing, no rhonchi Abdominal: Soft, NT, ND, bowel sounds +, incisions clean and dry  Extremities: no edema, no cyanosis    The results of significant diagnostics from this hospitalization (including imaging, microbiology, ancillary and laboratory) are listed below for reference.     Microbiology: Recent Results (from the past 240 hour(s))  Surgical pcr screen     Status: None   Collection Time: 10/29/17 12:15 AM  Result Value Ref Range Status   MRSA, PCR NEGATIVE NEGATIVE Final   Staphylococcus aureus NEGATIVE NEGATIVE Final    Comment: (NOTE) The Xpert SA Assay (FDA approved for NASAL specimens in patients 23 years of age and older), is  one component of a comprehensive surveillance program. It is not intended to diagnose infection nor to guide or monitor treatment. Performed at [facility], [address], [place], [place] [address]      Labs: BNP (last 3 results) No results for input(s): BNP in the last 8760 hours. Basic Metabolic Panel: Recent Labs  Lab 10/25/17 2008 10/26/17 0253 10/27/17 0742 10/29/17 1157 10/30/17 0358  NA 137 136 136 138 137  K 3.3* 3.7 3.4* 3.8 4.0  CL 100* 103 104 104 104  CO2 24 22 23 24 22   GLUCOSE 106* 120* 113* 137* 105*  BUN 6 <5* 5* 5* 6  CREATININE 1.03 0.93 0.76 0.87 0.80  CALCIUM 9.3 8.9 8.7* 8.9 8.9  MG 2.1 2.0  --   --   --   PHOS 3.5 2.9  --   --   --    Liver Function Tests: Recent Labs  Lab 10/25/17 2008 10/26/17 0253 10/27/17 0742 10/29/17 1157 10/30/17 0358  AST 86* 128* 95* 65* 51*  ALT 242* 249* 198* 147* 124*  ALKPHOS 162* 196* 222* 214* 176*  BILITOT 1.2 2.1* 3.6* 0.7 0.8  PROT 7.5 7.0 6.2* 6.8 6.4*  ALBUMIN 3.9 3.8 3.3* 3.1* 3.0*   Recent Labs  Lab 10/25/17 2008 10/29/17 1157  LIPASE 40 149*   No results for input(s): AMMONIA in the last 168  hours. CBC: Recent Labs  Lab 10/25/17 2008 10/26/17 0253 10/27/17 0742 10/29/17 1157 10/30/17 0358  WBC 13.0* 13.8* 13.7* 14.4* 15.3*  HGB 16.2 15.5 13.8 14.2 13.3  HCT 45.8 44.6 40.4 41.5 38.3*  MCV 90.9 90.5 91.4 90.8 90.5  PLT 276 286 266 320 293   Cardiac Enzymes: Recent Labs  Lab 10/25/17 2008  CKTOTAL 54   BNP: Invalid input(s): POCBNP CBG: No results for input(s): GLUCAP in the last 168 hours. D-Dimer No results for input(s): DDIMER in the last 72 hours. Hgb A1c No results for input(s): HGBA1C in the last 72 hours. Lipid Profile No results for input(s): CHOL, HDL, LDLCALC, TRIG, CHOLHDL, LDLDIRECT in the last 72 hours. Thyroid function studies No results for input(s): TSH, T4TOTAL, T3FREE, THYROIDAB in the last 72 hours.  Invalid input(s): FREET3 Anemia work up No results for input(s): VITAMINB12, FOLATE, FERRITIN, TIBC, IRON, RETICCTPCT in the last 72 hours. Urinalysis    Component Value Date/Time   COLORURINE YELLOW 10/25/2017 2008   APPEARANCEUR CLEAR 10/25/2017 2008   LABSPEC 1.015 10/25/2017 2008   PHURINE 6.0 10/25/2017 2008   GLUCOSEU NEGATIVE 10/25/2017 2008   HGBUR SMALL (A) 10/25/2017 2008   BILIRUBINUR NEGATIVE 10/25/2017 2008   KETONESUR NEGATIVE 10/25/2017 2008   PROTEINUR NEGATIVE 10/25/2017 2008   NITRITE NEGATIVE 10/25/2017 2008   LEUKOCYTESUR TRACE (A) 10/25/2017 2008   Sepsis Labs Invalid input(s): PROCALCITONIN,  WBC,  LACTICIDVEN Microbiology Recent Results (from the past 240 hour(s))  Surgical pcr screen     Status: None   Collection Time: 10/29/17 12:15 AM  Result Value Ref Range Status   MRSA, PCR NEGATIVE NEGATIVE Final   Staphylococcus aureus NEGATIVE NEGATIVE Final    Comment: (NOTE) The Xpert SA Assay (FDA approved for NASAL specimens in patients 18 years of age and older), is one component of a comprehensive surveillance program. It is not intended to diagnose infection nor to guide or monitor treatment. Performed at  [facility], [address] [place], [place] [address]      Time coordinating discharge: 30 minutes  SIGNED:  Victorino Dike  Alvino Chapel, DO [facility] Pager [phone]  If 7PM-7AM, please contact night-coverage [email or web address] Password [facility] 10/30/2017, 11:21 AM

## 2017-11-02 LAB — HEPATITIS PANEL, ACUTE
HCV Ab: <0.1 s/co ratio (ref 0.0–0.9)
HEP B C IGM: NEGATIVE
Hep A IgM: NEGATIVE

## 2019-06-15 IMAGING — CT CT ABD-PELV W/ CM
2 of 5 series · 15 of 46 positions shown, 17 images · IV contrast (Omni 300)
Comparison: None.

CLINICAL DATA: Acute onset of umbilical abdominal pain and nausea.

EXAM:
CT ABDOMEN AND PELVIS WITH CONTRAST
TECHNIQUE: Multidetector CT imaging of the abdomen and pelvis was performed
using the standard protocol following bolus administration of
intravenous contrast.
CONTRAST:  100 mL [id number] IOPAMIDOL ([id number]) INJECTION 61%

[Series 3: a/p w/ 5mm · axial · 0.74mm/px · z∈[-478,-63]mm · 12 of 95 slices shown, 14 images]
[im 6/95  soft-tissue]
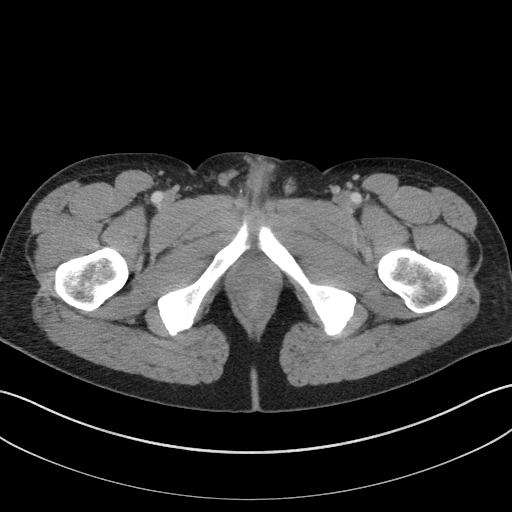
[im 6/95  bone]
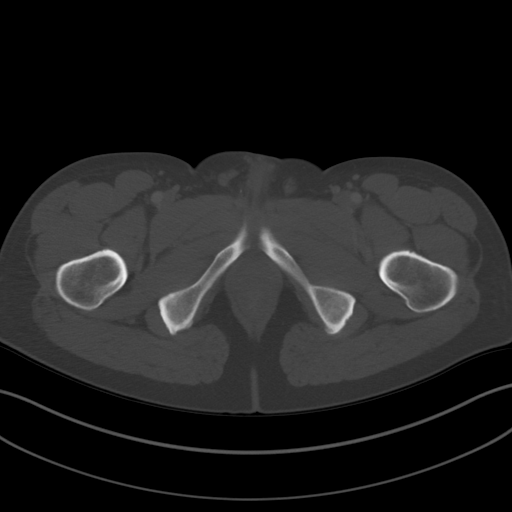
[im 17/95  soft-tissue]
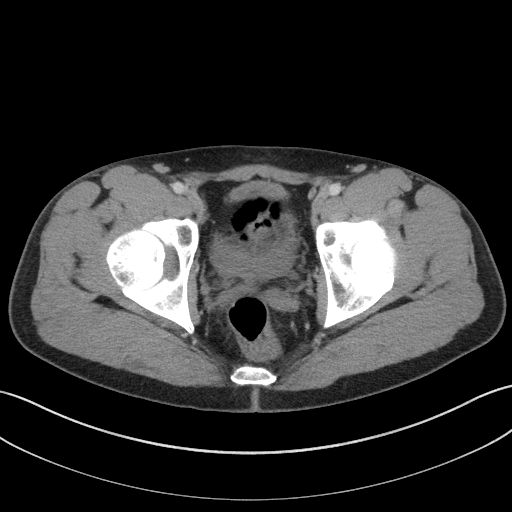
[im 23/95  soft-tissue]
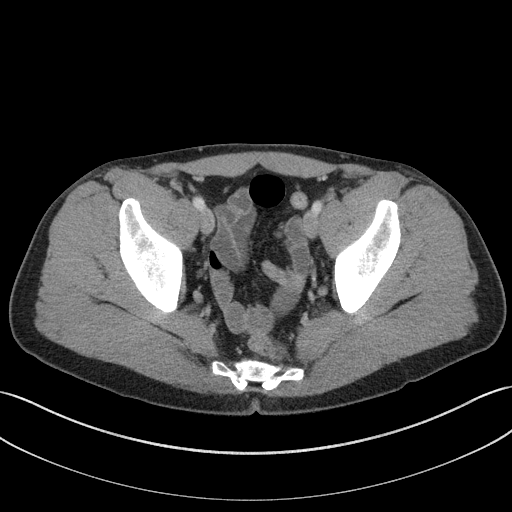
[im 28/95  soft-tissue]
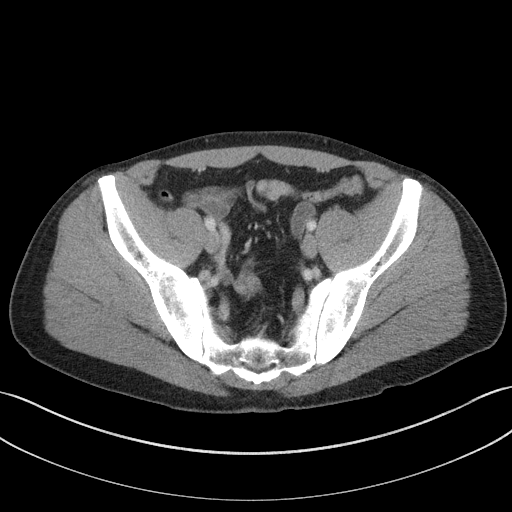
[im 39/95  soft-tissue]
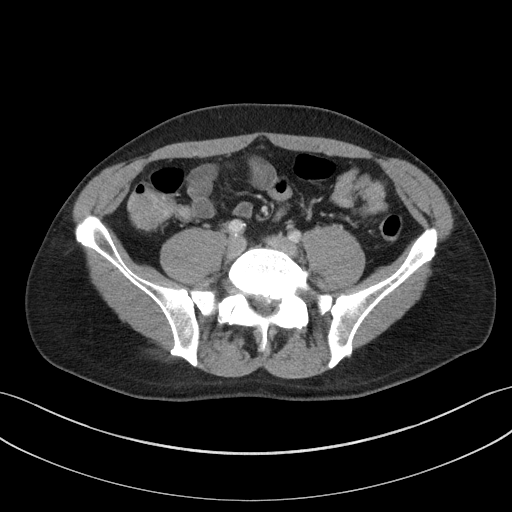
[im 45/95  soft-tissue]
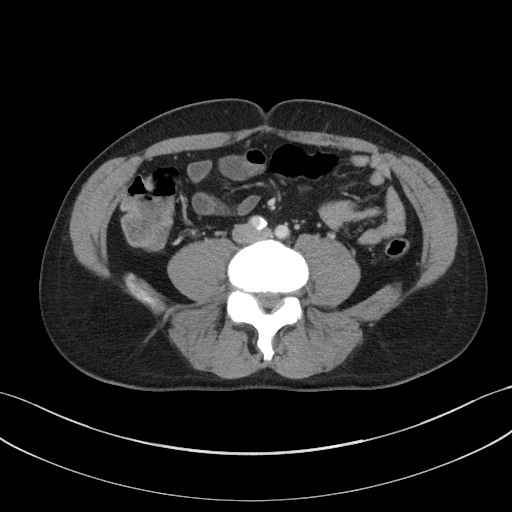
[im 50/95  soft-tissue]
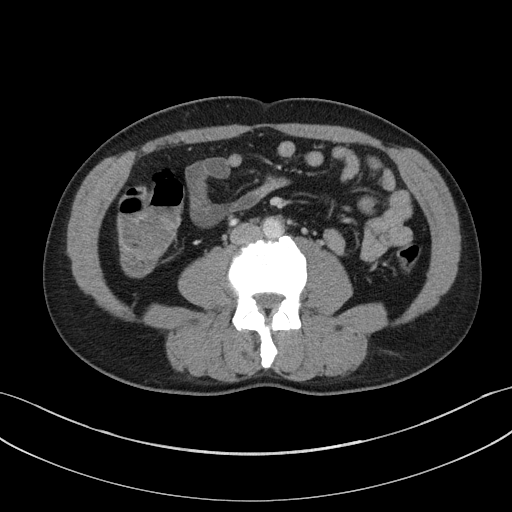
[im 61/95  soft-tissue]
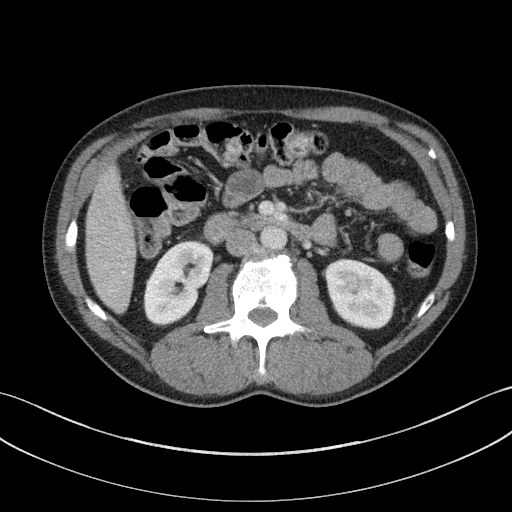
[im 67/95  soft-tissue]
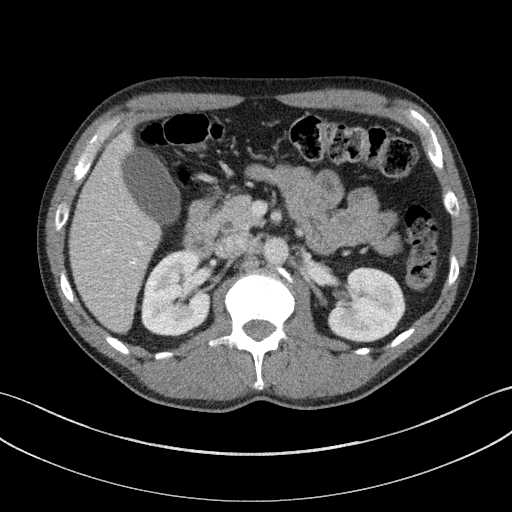
[im 67/95  bone]
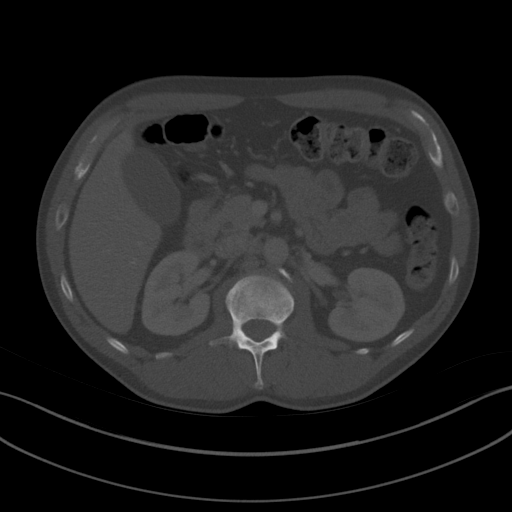
[im 72/95  soft-tissue]
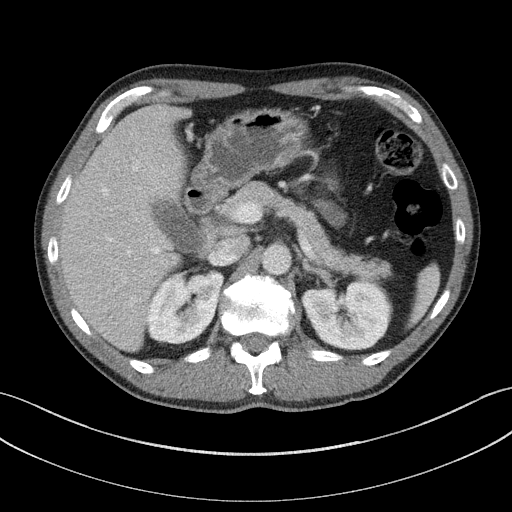
[im 83/95  soft-tissue]
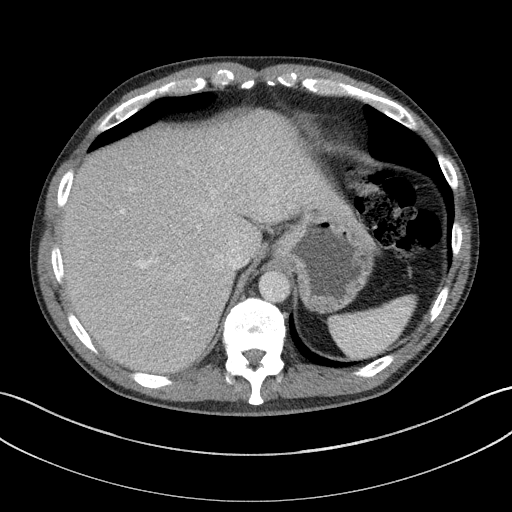
[im 89/95  soft-tissue]
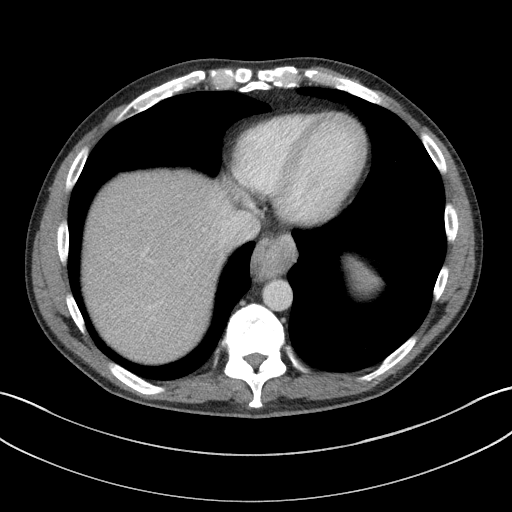

[Series 6: a/p w/ cor · coronal · 0.69mm/px · 3 of 124 slices shown]
[im 42/124  soft-tissue]
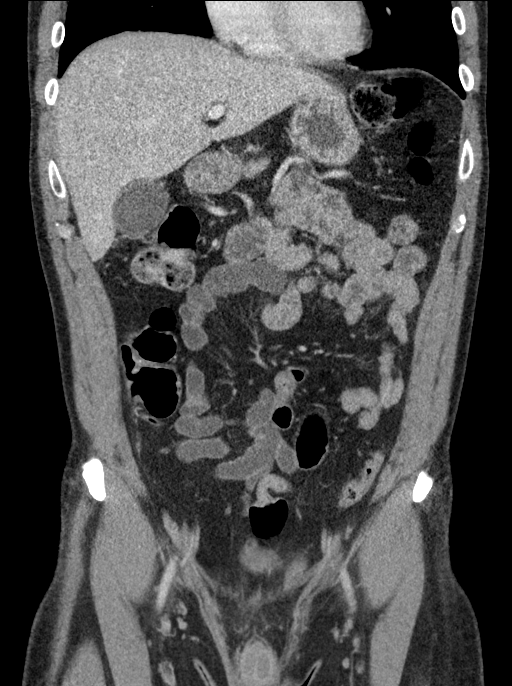
[im 55/124  soft-tissue]
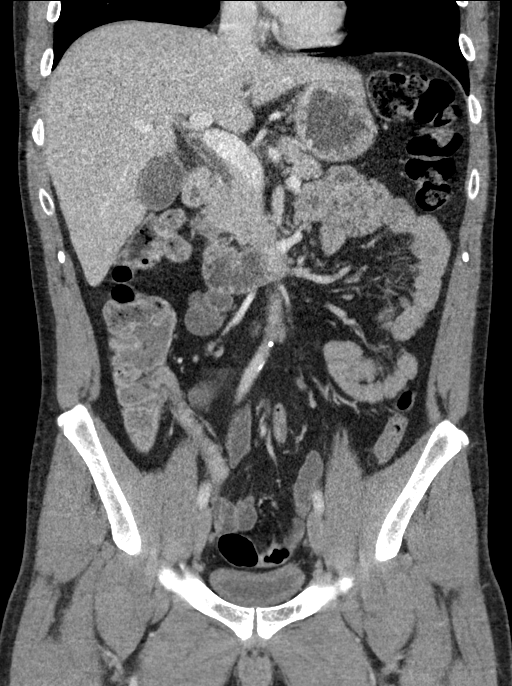
[im 69/124  soft-tissue]
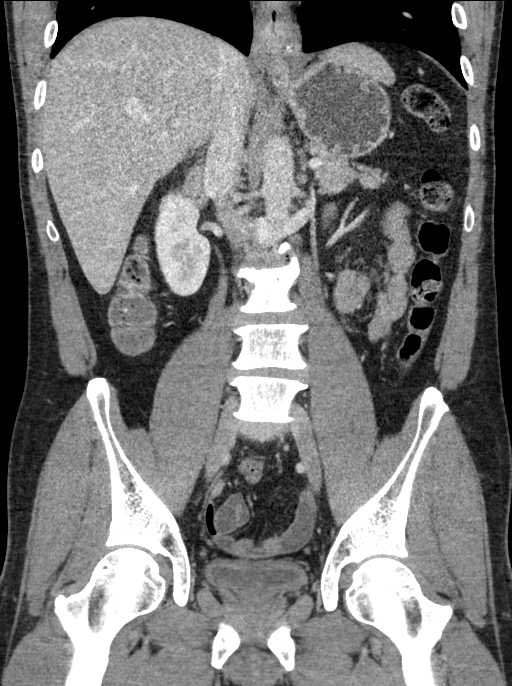

[15 of 46 positions shown; findings below may reference images not displayed]

FINDINGS: Lower chest: The visualized lung bases are grossly clear. The
visualized portions of the mediastinum are unremarkable.

There is focal wall thickening and minimal calcification at the
distal esophagus, with a few adjacent nodes measuring up to 7 mm in
short axis. A 9 mm node is noted at the gastroesophageal junction.
Malignancy is a concern.

Hepatobiliary: The liver is unremarkable in appearance. The
gallbladder is unremarkable in appearance. The common bile duct
remains normal in caliber.

Pancreas: The pancreas is within normal limits.

Spleen: The spleen is unremarkable in appearance.

Adrenals/Urinary Tract: The adrenal glands are unremarkable in
appearance. The kidneys are within normal limits. There is no
evidence of hydronephrosis. No renal or ureteral stones are
identified. No perinephric stranding is seen.

Stomach/Bowel: The stomach is unremarkable in appearance. The small
bowel is within normal limits. The patient is status post
appendectomy. The colon is unremarkable in appearance.

Vascular/Lymphatic: Mild scattered calcification is seen along the
distal abdominal aorta and its branches. A retroaortic left renal
vein is noted. The inferior vena cava is grossly unremarkable. No
retroperitoneal lymphadenopathy is seen. No pelvic sidewall
lymphadenopathy is identified.

Reproductive: The bladder is mildly distended and grossly
unremarkable. The prostate is normal in size.

Other: No additional soft tissue abnormalities are seen.

Musculoskeletal: No acute osseous abnormalities are identified. The
visualized musculature is unremarkable in appearance.
IMPRESSION: 1. No acute abnormality seen to explain the patient's symptoms.
2. Focal wall thickening and minimal calcification at the distal
esophagus, with a few adjacent nodes measuring up to 7 mm in short
axis. 9 mm node noted at the gastroesophageal junction. This raises
concern for malignancy. Endoscopy is recommended for further
evaluation, when and as deemed clinically appropriate.

Aortic Atherosclerosis ([id number].X).

These results were called by telephone at the time of interpretation
on 10/25/2017 at [DATE] to Dr. Kait Giordano, who verbally
acknowledged these results.

## 2024-07-16 ENCOUNTER — Encounter (HOSPITAL_COMMUNITY): Payer: Self-pay

## 2024-07-16 ENCOUNTER — Emergency Department (HOSPITAL_COMMUNITY)
Admission: EM | Admit: 2024-07-16 | Discharge: 2024-07-16 | Disposition: A | Discharge status: Home / Self Care | Attending: Emergency Medicine | Admitting: Emergency Medicine

## 2024-07-16 ENCOUNTER — Other Ambulatory Visit: Payer: Self-pay

## 2024-07-16 DIAGNOSIS — M5412 Radiculopathy, cervical region: Secondary | ICD-10-CM | POA: Insufficient documentation

## 2024-07-16 DIAGNOSIS — M25511 Pain in right shoulder: Secondary | ICD-10-CM | POA: Diagnosis present

## 2024-07-16 MED ORDER — METHOCARBAMOL 500 MG PO TABS: 500.0000 mg | ORAL_TABLET | Freq: Two times a day (BID) | ORAL | 0 refills | Status: AC

## 2024-07-16 MED ORDER — METHOCARBAMOL 500 MG PO TABS
1000.0000 mg | ORAL_TABLET | Freq: Once | ORAL | Status: AC
  Administered 2024-07-16: 1000 mg via ORAL
  Filled 2024-07-16: qty 2

## 2024-07-16 MED ORDER — OXYCODONE HCL 5 MG PO TABS: 5.0000 mg | ORAL_TABLET | Freq: Four times a day (QID) | ORAL | 0 refills | Status: AC | PRN

## 2024-07-16 MED ORDER — LIDOCAINE 5 % EX PTCH: 1.0000 | MEDICATED_PATCH | CUTANEOUS | 0 refills | Status: AC

## 2024-07-16 MED ORDER — OXYCODONE-ACETAMINOPHEN 5-325 MG PO TABS
1.0000 | ORAL_TABLET | Freq: Once | ORAL | Status: AC
  Administered 2024-07-16: 1 via ORAL
  Filled 2024-07-16: qty 1

## 2024-07-16 MED ORDER — DEXAMETHASONE 4 MG PO TABS
4.0000 mg | ORAL_TABLET | Freq: Once | ORAL | Status: AC
  Administered 2024-07-16: 4 mg via ORAL
  Filled 2024-07-16: qty 1

## 2024-07-16 MED ORDER — KETOROLAC TROMETHAMINE 15 MG/ML IJ SOLN
15.0000 mg | Freq: Once | INTRAMUSCULAR | Status: AC
  Administered 2024-07-16: 15 mg via INTRAMUSCULAR
  Filled 2024-07-16: qty 1

## 2024-07-16 MED ORDER — METHYLPREDNISOLONE 4 MG PO TBPK: ORAL_TABLET | ORAL | 0 refills | Status: AC

## 2024-07-16 MED ORDER — LIDOCAINE 5 % EX PTCH
1.0000 | MEDICATED_PATCH | CUTANEOUS | Status: DC
  Administered 2024-07-16: 1 via TRANSDERMAL
  Filled 2024-07-16: qty 1

## 2024-07-16 NOTE — Discharge Instructions (Signed)
 Your back pain is most likely due to a muscular strain.  There is been a lot of research on neck and back pain, unfortunately the only thing that seems to really help is Tylenol  and ibuprofen.  Relative rest is also important to not lift greater than 10 pounds bending or twisting at the waist.  Please follow-up with your family physician.  The other thing that really seems to benefit patients is physical therapy which your doctor may send you for.  Please return to the emergency department for new numbness or weakness to your arms or legs. Difficulty with urinating or urinating or pooping on yourself.  Also if you cannot feel toilet paper when you wipe or get a fever.   Take 4 over the counter ibuprofen tablets 3 times a day or 2 over-the-counter naproxen tablets twice a day for pain. Also take tylenol  1000mg (2 extra strength) four times a day.   Additionally, I have given you a prescription for steroids which can help with inflammation.  Take this as prescribed in its entirety.  Have given you a few doses of oxycodone  to help with severe breakthrough pain.  Please be advised that this is a narcotic pain medication and only taking as prescribed as needed.  Do not drive or operate heavy machinery while taking this medication as it can be sedating.  You can also make you your fall risk higher and therefore I recommend exercising caution before taking it.  I have also given you Robaxin which is a muscle relaxer and can also make you somewhat drowsy.  I have prescribed you lidocaine  patches as well which we gave you in the emergency department today, you can stick this on any areas that hurt.  Return if development of any new or worsening symptoms.

## 2024-07-16 NOTE — ED Provider Notes (Signed)
 [place] EMERGENCY DEPARTMENT AT [facility] Provider Note   CSN: [id number] Arrival date & time: 07/16/24  9082     Patient presents with: Shoulder Pain   Bryan Flynn is a 53 y.o. male.   Patient with history of GERD presents today with complaints of right shoulder/back pain. Reports that same began about 8 days ago when he was holding a large pipe on his shoulder with his arm wrapped around it. Reports he was in this position for some time and has had persistent pain since then. Reports relief of pain when he puts his chin to his chest, however states when he lifts his head he feels pain that radiates into his right shoulder. Has been trying BC powder and PainQyuil with minimal improvement. Reports that he has also carried some heavy items since then as well. Reports that on Monday (5 days ago) he went to the chiropractor who felt that his muscles in his shoulder area were tight, however patient reports that the therapies he did seemed to make his pain worse. He reports that they did manipulate his neck. Denies any sudden onset worsening pain after this. Denies headaches, vision changes, dizziness, nausea, or vomiting.  Denies loss of bowel or bladder function or saddle anesthesia.   The history is provided by the patient. No language interpreter was used.  Shoulder Pain      Prior to Admission medications   Medication Sig Start Date End Date Taking? Authorizing Provider  HYDROcodone -acetaminophen  (NORCO/VICODIN) 5-325 MG tablet Take 1-2 tablets by mouth every 6 (six) hours as needed. 10/30/17   Debby Hila, MD  lansoprazole  (PREVACID ) 15 MG capsule Take 1 capsule (15 mg total) by mouth daily. 10/30/17 11/29/17  Rojelio Nest, DO  ondansetron  (ZOFRAN ) 4 MG tablet Take 1 tablet (4 mg total) by mouth every 8 (eight) hours as needed for nausea or vomiting. 10/23/17   Gideons Lalama, Deanna M, PA-C    Allergies: Patient has no known allergies.    Review of Systems   Musculoskeletal:  Positive for myalgias.  All other systems reviewed and are negative.   Updated Vital Signs BP (!) 143/94   Pulse 74   Temp (!) 97.4 F (36.3 C)   Resp 14   Ht 5' 10 (1.778 m)   Wt 97.5 kg   SpO2 99%   BMI 30.85 kg/m   Physical Exam Vitals and nursing note reviewed.  Constitutional:      General: He is not in acute distress.    Appearance: Normal appearance. He is normal weight. He is not ill-appearing, toxic-appearing or diaphoretic.  HENT:     Head: Normocephalic and atraumatic.  Eyes:     Extraocular Movements: Extraocular movements intact.     Pupils: Pupils are equal, round, and reactive to light.  Neck:     Comments: No midline tenderness to palpation of the cervical spine.  Does have right sided palpable muscle tightness and tenderness to palpation of surrounding musculature between the spine near C7-T1 and the scapula. + Spurling test. No bony tenderness, no bruising, no deformity. ROM intact to the neck with some discomfort Cardiovascular:     Rate and Rhythm: Normal rate.  Pulmonary:     Effort: Pulmonary effort is normal. No respiratory distress.  Musculoskeletal:        General: Normal range of motion.     Cervical back: Normal range of motion.     Comments: No midline tenderness to palpation of the thoracic or lumbar spine.  No step-offs, lesions, deformity, or overlying skin changes.  Right shoulder without focal bony tenderness. Able to fully actively range his right shoulder with some discomfort. Good radial pulse.   Observed to be ambulatory with steady gait  Skin:    General: Skin is warm and dry.  Neurological:     General: No focal deficit present.     Mental Status: He is alert and oriented to person, place, and time.  Psychiatric:        Mood and Affect: Mood normal.        Behavior: Behavior normal.     (all labs ordered are listed, but only abnormal results are displayed) Labs Reviewed - No data to  display  EKG: None  Radiology: No results found.   Procedures   Medications Ordered in the ED  lidocaine  (LIDODERM ) 5 % 1 patch (1 patch Transdermal Patch Applied 07/16/24 1016)  ketorolac  (TORADOL ) 15 MG/ML injection 15 mg (15 mg Intramuscular Given 07/16/24 1016)  methocarbamol (ROBAXIN) tablet 1,000 mg (1,000 mg Oral Given 07/16/24 1016)                                    Medical Decision Making Risk Prescription drug management.   Patient presents today with complaints of with neck pain x 8 days.  They are afebrile, nontoxic-appearing, and in no acute distress reassuring vital signs.  Physical exam reveals no midline tenderness to palpation of the cervical spine.  Does have right sided palpable muscle tightness and tenderness to palpation of surrounding musculature between the spine near C7-T1 and the scapula. + Spurling test. No bony tenderness, no bruising, no deformity. ROM intact to the neck with some discomfort. My emergent differential diagnosis includes cervical radiculopathy, slipped disc, compression fracture, spondylolisthesis, less clinical concern for epidural abscess or osteomyelitis based on patient history.  Overall with high clinical suspicion for muscle strain, based on clinical presentation, risk factors.  No neurological deficits. Patient is ambulatory. No warning symptoms of back pain including: fecal incontinence, urinary retention or overflow incontinence, night sweats, waking from sleep with back pain, unexplained fevers or weight loss, h/o cancer, IVDU, recent trauma. Overall low clinical concern for cauda equina, epidural abscess, or other serious cause of back pain.  He did have neck manipulation at the chiropractor earlier in the week, however he has no signs or symptoms to suggest vertebral artery dissection. Given this work-up, evaluation, physical exam I do not believe that radiographic imaging is indicated at this time.  Conservative measures such as rest,  ice/heat, ibuprofen, Tylenol , and  prescription for Robaxin indicated with orthopedic follow-up if no improvement with conservative management. Will also give steroids for radicular symptoms and a few days of oxycodone  for severe breakthrough pain. PDMP reviewed. Patient advised not to drive or operate heavy machinery while taking these medications.  Extensive return precautions given. Evaluation and diagnostic testing in the emergency department does not suggest an emergent condition requiring admission or immediate intervention beyond what has been performed at this time.  Plan for discharge with close PCP follow-up.  Patient is understanding and amenable with plan, educated on red flag symptoms that would prompt immediate return.  Patient discharged in stable condition.  Final diagnoses:  Cervical radiculopathy    ED Discharge Orders          Ordered    methylPREDNISolone (MEDROL DOSEPAK) 4 MG TBPK tablet  07/16/24 1119    oxyCODONE  (ROXICODONE ) 5 MG immediate release tablet  Every 6 hours PRN        07/16/24 1119    methocarbamol (ROBAXIN) 500 MG tablet  2 times daily        07/16/24 1119    lidocaine  (LIDODERM ) 5 %  Every 24 hours        07/16/24 1119          An After Visit Summary was printed and given to the patient.      Nora Lauraine DELENA DEVONNA 07/16/24 1123    Levander Houston, MD 07/18/24 1336

## 2024-07-16 NOTE — ED Triage Notes (Signed)
 Pt c.o right shoulder blade pain that radiates down his right arm for the past 1.5 weeks, states before it started he was carrying a big pipe on his shoulder. Pt has been to a chiropractor twice and thinks that made the pain worse, pt taking OTC meds without relief
